# Patient Record
Sex: Male | Born: 1950 | Hispanic: No | Marital: Married | Smoking: Former smoker
Health system: Southern US, Community
[De-identification: ages and names within clinical notes are randomized; demographics above are authoritative.]

## PROBLEM LIST (undated history)

## (undated) DIAGNOSIS — G4730 Sleep apnea, unspecified: Secondary | ICD-10-CM

## (undated) DIAGNOSIS — M1990 Unspecified osteoarthritis, unspecified site: Secondary | ICD-10-CM

## (undated) DIAGNOSIS — I10 Essential (primary) hypertension: Secondary | ICD-10-CM

## (undated) DIAGNOSIS — Z974 Presence of external hearing-aid: Secondary | ICD-10-CM

## (undated) DIAGNOSIS — A6920 Lyme disease, unspecified: Secondary | ICD-10-CM

## (undated) DIAGNOSIS — J45909 Unspecified asthma, uncomplicated: Secondary | ICD-10-CM

## (undated) DIAGNOSIS — I2699 Other pulmonary embolism without acute cor pulmonale: Secondary | ICD-10-CM

## (undated) DIAGNOSIS — Z87828 Personal history of other (healed) physical injury and trauma: Secondary | ICD-10-CM

## (undated) HISTORY — PX: HX KNEE ARTHROSCOPY: SHX127

## (undated) HISTORY — DX: Essential (primary) hypertension: I10

## (undated) HISTORY — DX: Lyme disease, unspecified: A69.20

## (undated) HISTORY — DX: Sleep apnea, unspecified: G47.30

## (undated) HISTORY — PX: KNEE ARTHROSCOPY: SUR90

## (undated) HISTORY — PX: HERNIA REPAIR: SHX51

## (undated) HISTORY — PX: BACK SURGERY: SHX140

## (undated) HISTORY — DX: Other pulmonary embolism without acute cor pulmonale: I26.99

---

## 1998-03-21 LAB — HM COLONOSCOPY: HM COLON: NORMAL

## 2007-06-15 ENCOUNTER — Ambulatory Visit: Payer: Self-pay

## 2008-03-21 DIAGNOSIS — I2699 Other pulmonary embolism without acute cor pulmonale: Secondary | ICD-10-CM

## 2008-03-21 HISTORY — DX: Other pulmonary embolism without acute cor pulmonale: I26.99

## 2008-07-19 ENCOUNTER — Ambulatory Visit: Admission: RE | Admit: 2008-07-19 | Payer: Self-pay | Source: Ambulatory Visit | Admitting: Family Medicine

## 2008-12-22 ENCOUNTER — Ambulatory Visit: Admit: 2008-12-22 | Payer: Self-pay | Source: Ambulatory Visit

## 2008-12-26 ENCOUNTER — Ambulatory Visit: Admission: RE | Admit: 2008-12-26 | Payer: Self-pay | Source: Ambulatory Visit

## 2009-07-13 ENCOUNTER — Ambulatory Visit: Admission: RE | Admit: 2009-07-13 | Payer: Self-pay | Source: Ambulatory Visit

## 2012-08-29 ENCOUNTER — Other Ambulatory Visit (HOSPITAL_BASED_OUTPATIENT_CLINIC_OR_DEPARTMENT_OTHER): Payer: Self-pay | Admitting: Rheumatology

## 2012-09-11 ENCOUNTER — Ambulatory Visit (HOSPITAL_BASED_OUTPATIENT_CLINIC_OR_DEPARTMENT_OTHER)
Admission: RE | Admit: 2012-09-11 | Discharge: 2012-09-11 | Disposition: A | Discharge status: Home / Self Care | Source: Ambulatory Visit | Attending: Rheumatology | Admitting: Rheumatology

## 2012-09-11 DIAGNOSIS — M25569 Pain in unspecified knee: Secondary | ICD-10-CM | POA: Insufficient documentation

## 2012-09-11 DIAGNOSIS — M25469 Effusion, unspecified knee: Secondary | ICD-10-CM | POA: Insufficient documentation

## 2012-10-02 ENCOUNTER — Encounter (INDEPENDENT_AMBULATORY_CARE_PROVIDER_SITE_OTHER): Payer: Self-pay | Admitting: Emergency Medical Services

## 2012-10-02 ENCOUNTER — Ambulatory Visit (INDEPENDENT_AMBULATORY_CARE_PROVIDER_SITE_OTHER): Admitting: Emergency Medical Services

## 2012-10-02 VITALS — BP 134/70 | HR 71 | Temp 97.4°F | Resp 16 | Ht 70.0 in | Wt 199.0 lb

## 2012-10-02 MED ORDER — CEPHALEXIN 500 MG CAPSULE
500.00 mg | ORAL_CAPSULE | Freq: Two times a day (BID) | ORAL | Status: AC
Start: 2012-10-02 — End: 2012-10-12

## 2012-10-02 NOTE — Patient Instructions (Signed)
[facility]  [address]  [place], [place] [address]  Phone: [phone]  Fax: [phone]               Open Daily 12:00pm - 8:00pm ~ Closed Thanksgiving and Christmas Day     Attending Caregiver: Threasa Alpha, MD      Today's orders:   Orders Placed This Encounter   . cephALEXin (KEFLEX) 500 mg Oral Capsule        Prescription(s) E-Rx to:  [id number] - [place], [place] - [address]    ________________________________________________________________________  Short Term Disability and Family Medical Leave Act  [facility] does NOT provide assistance with any disability applications.  If you feel your medical condition requires you to be on disability, you will need to follow up with  Your primary care physician or a specialist.  We apologize for any inconvenience.    For Medication Prescribed by [facility]:  As an Urgent Care facility, our clinic does NOT offer prescription refills over the telephone.    If you need more of the medication one of our medical providers prescribed, you will  Either need to be re-evaluated by [place] or see your primary care physician.    ________________________________________________________________________      It is very important that we have a phone number that is the single best way to contact you in the event that we become aware of important clinical information or concerns after your discharge.  If the phone number you provided at registration is NOT this number you should inform staff and registration prior to leaving.       Your treatment and evaluation today was focused on identifying and treating potentially emergent conditions based on your presenting signs, symptoms, and history.  The resulting initial clinical impression and treatment plan is not intended to be definitive or a substitute for a full physical examination and evaluation by your primary care provider.  If your symptoms persist, worsen, or you develop any new or concerning symptoms, you need to be evaluated.      If you received x-rays during your visit, be aware that the final and formal interpretation of those films by a radiologist may occur after your discharge.  If there is a significant discrepancy identified after your discharge, we will contact you at the telephone number provided at registration.      If you received a pelvic exam, you may have cultures pending for sexually transmitted diseases.  Positive cultures are reported to the [facility] Department of Health as required by state law.  You should be contacted if you cultures are positive.  We will not contact you if they are negative.  You did NOT receive a PAP smear (the screening test for cervical).  This specific test for women is best performed by your gynecologist or primary care provider when indicated.      If you are over 57 year old, we cannot discuss your personal health information with a parent, spouse, family member, or anyone else without your express consent.  This does not include those who have legitimate access to your records and information to assist in your care under the provisions of HIPAA [facility] Portability and Accountability Act) law, or those to whom you have previously given express written consent to do so, such a legal guardian or Power of [place].       You may have received medication that may cause you  to feel drowsy and/or light headed for several hours.  You may even experience some amnesia of your stay.  You should avoid operating a motor vehicle or performing any activity requiring complete alertness or coordination until you feel fully awake (approximately 24-48 hours).  Avoid alcoholic beverages.  You may also have a dry mouth for several hours.  This is a normal side effect and will disappear as the effects of the medication wear off.      Instructions discussed with patient upon discharge by clinical staff with all questions answered.  Please call [facility] [phone]) if any further questions.  Go immediately to the emergency department if any concern or worsening symptoms.      Threasa Alpha, MD 10/02/2012, 1:33 PM          Spider Bite  Most spider bites do not cause serious problems.  HOME CARE   Do not scratch the bite.   Keep the bite clean and dry. Wash the bite with soap and water as told by your doctor.   Put ice on the bite.   Put ice in a plastic bag.   Place a towel between your skin and the bag.   Leave the ice on for 20 minutes. Do this 4 times a day for the first 2 to 3 days or as told by your doctor.   Raise (elevate) the bite above your heart.   Only take medicine as told by your doctor.   If you are given medicines (antibiotics), take them as told. Finish them even if you start to feel better.  You may need a tetanus shot if:   You cannot remember when you had your last tetanus shot.   You have never had a tetanus shot.   The bite broke your skin.  If you need a tetanus shot and you choose not to have one, you may get tetanus. Sickness from tetanus can be serious.  GET HELP RIGHT AWAY IF:   Your bite turns purple.   Your bite gets more puffy (swollen), painful, or red.   You are short of breath or have chest pain.   You have muscle cramps or painful muscle spasms.   You have belly (abdominal) pain.    You feel sick to your stomach (nauseous) or throw up (vomit).   You feel very tired or sleepy.   Your bite is not better after 3 days of treatment.  MAKE SURE YOU:   Understand these instructions.   Will watch your condition.   Will get help right away if you are not doing well or get worse.  Document Released: 04/09/2010 Document Revised: 05/30/2011 Document Reviewed: 10/06/2010  [facility] Patient Information 2014 [place], [place].  Clostridium Difficile Infection  Clostridium difficile (C. diff) is a germ found in the intestines. C. diff infection can occur after taking antibiotic medicines. C. diff infection can cause watery poop (diarrhea) or severe disease.  HOME CARE    Drink enough fluids to keep your pee (urine) clear or pale yellow. Avoid milk, caffeine, and alcohol.   Ask your doctor how to replace body fluid losses (rehydrate).   Eat small meals more often rather than large meals.   Take your medicine (antibiotics) as told. Finish it even if you start to feel better.   Do not  use medicines to slow the watery poop.   Wash your hands well after using the bathroom and before preparing food.   Make sure people who live with  you wash their hands often.  GET HELP RIGHT AWAY IF:    The watery poop does not stop, or it comes back after you finish your medicine.   You feel very dry or thirsty (dehydrated).   You have a fever.   You have more belly (abdominal) pain or tenderness.   There is blood in your poop (stool), or your poop is black and tar-like.   You cannot eat food or drink liquids without throwing up (vomiting).  MAKE SURE YOU:    Understand these instructions.   Will watch your condition.   Will get help right away if you are not doing well or get worse.  Document Released: 01/02/2009 Document Revised: 05/30/2011 Document Reviewed: 08/13/2010  [facility] Patient Information 2014 [place], [place].

## 2012-10-02 NOTE — Progress Notes (Signed)
 BP 134/70  Pulse 71  Temp(Src) 36.3 C (97.4 F) (Oral)  Resp 16  Ht 1.778 m (5' 10)  Wt 90.266 kg (199 lb)  BMI 28.55 kg/m2  Catalina Rigg, RTR 10/02/2012, 1:00 PM

## 2012-10-02 NOTE — Progress Notes (Signed)
 Subjective:               62 year old male presents to urgent care with an spider bite to his left calf that occurred approximately one week ago. He now has some associated redness, which has spread outward from the bite mark. He denies any associated fevers. The patient denies any recent known tick exposure. He does have chronic Lyme disease for which he takes methotrexate. The patient has also been bitten by a brown recluse spider in the past. He denies any shortness of breath, wheezes, nausea, vomiting, or diarrhea. He has tried topical Neosporin with little relief of symptoms. He denies any eye or ear pain, burning urination, or blood in his stool.     Current Outpatient Prescriptions   Medication Sig   .     . cetirizine (ZYRTEC) 10 mg Oral Tablet Take 10 mg by mouth Once a day   . hydrochlorothiazide (HYDRODIURIL) 25 mg Oral Tablet Take 25 mg by mouth Once a day   . Methotrexate (TREXALL) 10 mg Oral Tablet Take by mouth       Family History   Problem Relation Age of Onset   . Hypertension Mother    . Diabetes Mother        Active Ambulatory Problems     Diagnosis Date Noted   . No Active Ambulatory Problems     Resolved Ambulatory Problems     Diagnosis Date Noted   . No Resolved Ambulatory Problems     Past Medical History   Diagnosis Date   . Lyme disease    . HTN (hypertension)        Allergies   Allergen Reactions   . Amoxicillin        Objective:  Vitals Signs are stable   Filed Vitals:    10/02/12 1256   BP: 134/70   Pulse: 71   Temp: 36.3 C (97.4 F)   TempSrc: Oral   Resp: 16   Height: 1.778 m (5' 10)   Weight: 90.266 kg (199 lb)     Gen: no acute distress, non toxic looking  HEENT:  NCAT, PERRL, TMs wnl  Heart:  Regular without murmur  Lungs:  Clear without wheeze  Abd:  Soft, nt/nd, positive bowel sounds present  Extremities: No left calf edema, negative Homan's sign,  Skin:  Left medial calf erythematous macular rash, approximately 5 cm in diameter.      Labs:  None.      Assessment:              ICD-9-CM   1. Spider bite 989.5         Plan:        Orders Placed This Encounter   . cephALEXin  (KEFLEX ) 500 mg Oral Capsule              Physician advised patient to take full course of medicine, watch for diarrhea (C. Difficile discussed with patient) and signs of allergic reaction, ie, swelling, rash, difficulty breathing or swallowing.  Physician recommended ingestion of yogurt or probiotics while taking antibiotics. Instructional handouts provided. Plan was discussed and patient/parent/guardian verbalized understanding.  If symptoms are not improving the patient should return to the Urgent Care for further evaluation. If symptoms are worsening or emergent present to Emergency Department for higher level of evaluation and care. Patient voiced understanding and agreeable. All questions answered. Patient appreciative.       Alvina HILARIO Bathe, MD

## 2012-10-03 ENCOUNTER — Encounter (INDEPENDENT_AMBULATORY_CARE_PROVIDER_SITE_OTHER): Payer: Self-pay | Admitting: Emergency Medical Services

## 2012-10-03 NOTE — Progress Notes (Signed)
Patient seen at urgent care yesterday. Called to follow up and feeling about the same. Advised patient to return to the clinic if symptoms have not improved over the next several days. Advised to call if further questions or concerns.

## 2014-02-26 ENCOUNTER — Ambulatory Visit (INDEPENDENT_AMBULATORY_CARE_PROVIDER_SITE_OTHER): Admitting: Family Medicine

## 2014-02-26 ENCOUNTER — Encounter (INDEPENDENT_AMBULATORY_CARE_PROVIDER_SITE_OTHER): Payer: Self-pay | Admitting: Family Medicine

## 2014-02-26 VITALS — BP 136/70 | HR 62 | Temp 98.3°F | Resp 16 | Wt 215.6 lb

## 2014-02-26 DIAGNOSIS — A6923 Arthritis due to Lyme disease: Secondary | ICD-10-CM

## 2014-02-26 DIAGNOSIS — J209 Acute bronchitis, unspecified: Secondary | ICD-10-CM

## 2014-02-26 MED ORDER — AZITHROMYCIN 250 MG TABLET
ORAL_TABLET | ORAL | Status: DC
Start: 2014-02-26 — End: 2014-04-08

## 2014-02-26 MED ORDER — ALBUTEROL SULFATE 2.5 MG/3 ML (0.083 %) SOLUTION FOR NEBULIZATION
2.50 mg | INHALATION_SOLUTION | RESPIRATORY_TRACT | Status: DC | PRN
Start: 2014-02-26 — End: 2014-04-08

## 2014-02-26 MED ORDER — BENZONATATE 200 MG CAPSULE
200.00 mg | ORAL_CAPSULE | Freq: Three times a day (TID) | ORAL | Status: DC | PRN
Start: 2014-02-26 — End: 2014-04-08

## 2014-02-26 NOTE — Progress Notes (Signed)
Isaiah DuelErnest Patrick Dorn  Date of Service: 02/26/2014    Chief complaint:   Chief Complaint   Patient presents with    Cough    Chest Congestion     Subjective  63 y.o. male comes to Urgent Care clinic today for 6 days of cough and low grade temp.  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  Formerl;y worked in [employer] - lots of foreign travel but is now retired..     ROS: denies headaches, dizziness, chest pain or shortness of breath, denies black or bloody stools, abdominal pain, weakness or difficulty with ambulation, denies skin rash or lesion    History   Substance Use Topics    Smoking status: Former Smoker    Smokeless tobacco: Not on file    Alcohol Use: No     There is no problem list on file for this patient.    Family History   Problem Relation Age of Onset    Hypertension Mother     Diabetes Mother            Outpatient Prescriptions Prior to Visit:  cetirizine (ZYRTEC) 10 mg Oral Tablet Take 10 mg by mouth Once a day   hydrochlorothiazide (HYDRODIURIL) 25 mg Oral Tablet Take 25 mg by mouth Once a day   Methotrexate (TREXALL) 10 mg Oral Tablet Take by mouth     No facility-administered medications prior to visit.    Objective  Vitals: BP 136/70 mmHg   Pulse 62   Temp(Src) 36.8 C (98.3 F) (Oral)   Resp 16   Wt 97.796 kg (215 lb 9.6 oz)  General: no distress  Eyes: Conjunctiva clear., Pupils equal and round. , Sclera non-icteric.   HENT:TM's Clear. , Ear canals normal. , Nose without erythema. , Mouth mucous membranes moist. , Pharynx without injection or exudate. , has hearing aids in place  Neck: supple, symmetrical, trachea midline and mild anterior cervical adenopathy  Lungs: clear to auscultation bilaterally.   Cardiovascular:    Heart regular rate and rhythm without murmer  Abdomen: soft, non-tender, bowel sounds normal and no hepatosplenomegaly  Extremities: his right index finger  is still a little puffy  Skin: Skin warm and dry and No rashes    Assessment/Plan  1. 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         Pt verbalized understanding and agrees with the plan of care. Call or return to clinic prn if these symptoms worsen or fail to improve as anticipated.    Concha SeGail Kristin Akeisha Lagerquist, MD

## 2014-02-26 NOTE — Progress Notes (Signed)
BP 136/70 mmHg   Pulse 62   Temp(Src) 36.8 C (98.3 F) (Oral)   Resp 16   Wt 97.796 kg (215 lb 9.6 oz)  Isaiah Bryant Yetta BarreN Shatima Zalar, RN  02/26/2014, 10:28

## 2014-02-27 ENCOUNTER — Encounter (INDEPENDENT_AMBULATORY_CARE_PROVIDER_SITE_OTHER): Payer: Self-pay | Admitting: Family Medicine

## 2014-02-27 NOTE — Progress Notes (Signed)
Patient seen at urgent care yesterday. Called to follow up and feeling better. Advised to call if further questions or concerns.

## 2014-04-08 ENCOUNTER — Ambulatory Visit (INDEPENDENT_AMBULATORY_CARE_PROVIDER_SITE_OTHER): Admitting: Family Medicine

## 2014-04-08 ENCOUNTER — Encounter (INDEPENDENT_AMBULATORY_CARE_PROVIDER_SITE_OTHER): Payer: Self-pay | Admitting: Family Medicine

## 2014-04-08 VITALS — BP 123/70 | HR 57 | Temp 98.5°F | Ht 70.0 in | Wt 210.4 lb

## 2014-04-08 DIAGNOSIS — B370 Candidal stomatitis: Secondary | ICD-10-CM

## 2014-04-08 MED ORDER — NYSTATIN 100,000 UNIT/ML ORAL SUSPENSION
ORAL | Status: AC
Start: 2014-04-08 — End: ?

## 2014-04-08 NOTE — Progress Notes (Signed)
BP 123/70 mmHg   Pulse 57   Temp(Src) 36.9 C (98.5 F) (Oral)   Ht 1.778 m (5\' 10" )   Wt 95.437 kg (210 lb 6.4 oz)   BMI 30.19 kg/m2  Maximiano CossJacqueline Milton Streicher, RTR  04/08/2014, 10:16

## 2014-04-08 NOTE — Progress Notes (Signed)
Isaiah Bryant  Date of Service: 04/08/2014    Chief complaint:   Chief Complaint   Patient presents with    Thrush     Subjective  64 y.o. male comes to Urgent Care clinic today for thrush - he has had it before and recognizes it.  He attributes it to the recent increase in MTX to 20 mg.  Roof of mouth and posterior pharynx, not in esophagus, no pain with swallowing     ROS:  denies headaches, dizziness, chest pain or shortness of breath, denies black or bloody stools, abdominal pain, weakness or difficulty with ambulation, denies skin rash or lesion    History   Substance Use Topics    Smoking status: Former Smoker    Smokeless tobacco: Not on file    Alcohol Use: No     There is no problem list on file for this patient.    Family History   Problem Relation Age of Onset    Hypertension Mother     Diabetes Mother            Outpatient Prescriptions Prior to Visit:  cetirizine (ZYRTEC) 10 mg Oral Tablet Take 10 mg by mouth Once a day   hydrochlorothiazide (HYDRODIURIL) 25 mg Oral Tablet Take 25 mg by mouth Once a day   Methotrexate (TREXALL) 10 mg Oral Tablet Take 20 mg by mouth    albuterol sulfate (PROVENTIL) 2.5 mg /3 mL (0.083 %) Inhalation Solution for Nebulization 3 mL (2.5 mg total) by Nebulization route Every 4 hours as needed for Wheezing   azithromycin (ZITHROMAX) 250 mg Oral Tablet 2 tablets day 1, then 1 tablet daily   Benzonatate (TESSALON) 200 mg Oral Capsule Take 1 Cap (200 mg total) by mouth Three times a day as needed for Cough     No facility-administered medications prior to visit.    Objective  Vitals: BP 123/70 mmHg   Pulse 57   Temp(Src) 36.9 C (98.5 F) (Oral)   Ht 1.778 m (5\' 10" )   Wt 95.437 kg (210 lb 6.4 oz)   BMI 30.19 kg/m2  General: appears in good health  HENT:ENT without erythema or injection, mucous membranes moist; he does have thrush lesions on palate and posterior pharynx  Lungs: clear to auscultation bilaterally.   Cardiovascular:    Heart regular rate and rhythm  without murmer    Assessment/Plan  1. Thrush        Orders Placed This Encounter    nystatin (MYCOSTATIN) 100,000 unit/mL Oral Suspension     Swish and spit as he is fairly sure it is only in mouth, as it has been caught early    Pt verbalized understanding and agrees with the plan of care. Call or return to clinic prn if these symptoms worsen or fail to improve as anticipated.    Concha SeGail Kristin Denisa Enterline, MD

## 2014-04-08 NOTE — Patient Instructions (Signed)
[facility]  [address]  [place], [place] [id number]  Phone: [phone]  Fax: [phone]               Open Mon - Sat 8:00am - 8:00pm [place] CrockerSun Noon- 8:00pm                                                              ~ Closed Thanksgiving and Christmas Day     Attending Caregiver: Concha SeGail Kristin Emalene Welte, MD      Today's orders:   Orders Placed This Encounter    nystatin (MYCOSTATIN) 100,000 unit/mL Oral Suspension        Prescription(s) E-Rx to:  [facility] - [place], [place] - [address]    ________________________________________________________________________  Short Term Disability and Family Medical Leave Act  [facility] does NOT provide assistance with any disability applications.  If you feel your medical condition requires you to be on disability, you will need to follow up with  Your primary care physician or a specialist.  We apologize for any inconvenience.    For Medication Prescribed by [facility]:  As an Urgent Care facility, our clinic does NOT offer prescription refills over the telephone.    If you need more of the medication one of our medical providers prescribed, you will  Either need to be re-evaluated by us or see your primary care physician.    ________________________________________________________________________      It is very important that we have a phone number that is the single best way to contact you in the event that we become aware of important clinical information or concerns after your discharge.  If the phone number you provided at registration is NOT this number you should inform staff and registration prior to leaving.      Your treatment and evaluation today was focused on identifying and treating potentially emergent conditions based on your presenting signs, symptoms, and history.  The resulting initial clinical impression and treatment plan is not intended to be definitive or a substitute for a full physical examination  and evaluation by your primary care provider.  If your symptoms persist, worsen, or you develop any new or concerning symptoms, you need to be evaluated.      If you received x-rays during your visit, be aware that the final and formal interpretation of those films by a radiologist may occur after your discharge.  If there is a significant discrepancy identified after your discharge, we will contact you at the telephone number provided at registration.      If you received a pelvic exam, you may have cultures pending for sexually transmitted diseases.  Positive cultures are reported to the [facility] Department of Health as required by state law.  You should be contacted if you cultures are positive.  We will not contact you if they are negative.  You did NOT receive a PAP smear (the screening test for cervical).  This specific test for women is best performed by your gynecologist or primary care provider when indicated.      If you are over 521 year old, we cannot discuss your personal health information with a parent, spouse, family member, or anyone else without your express consent.  This  does not include those who have legitimate access to your records and information to assist in your care under the provisions of HIPAA [facility] Portability and Accountability Act) law, or those to whom you have previously given express written consent to do so, such a legal guardian or Power of [place].      You may have received medication that may cause you to feel drowsy and/or light headed for several hours.  You may even experience some amnesia of your stay.  You should avoid operating a motor vehicle or performing any activity requiring complete alertness or coordination until you feel fully awake (approximately 24-48 hours).  Avoid alcoholic beverages.  You may also have a dry mouth for several hours.  This is a normal side effect and will disappear as the effects of the medication wear off.      Instructions discussed  with patient upon discharge by clinical staff with all questions answered.  Please call  Urgent Care [phone]) if any further questions.  Go immediately to the emergency department if any concern or worsening symptoms.      Concha Se, MD 04/08/2014, 10:43

## 2014-04-09 ENCOUNTER — Encounter (INDEPENDENT_AMBULATORY_CARE_PROVIDER_SITE_OTHER): Payer: Self-pay

## 2014-04-09 NOTE — Progress Notes (Signed)
Pt seen in urgent care yesterday. Called today to follow up, pt states he is not feeling better. Patient states that he was able to pick up his prescribed medication. No questions or concerns at this time. Informed pt to call back if there are any questions or concerns.   Marcy Sirenharles Xela Oregel, RTR  04/09/2014, 08:45

## 2014-05-07 LAB — BASIC METABOLIC PANEL
BUN: 18 mg/dL (ref 4–21)
CREATININE: 1 mg/dL (ref 0.6–1.3)
Glucose: 98 mg/dL
Potassium: 3.8 mmol/L (ref 3.4–5.3)
Sodium: 137 mmol/L (ref 137–147)

## 2014-05-07 LAB — HEPATIC FUNCTION PANEL
ALT: 22 U/L (ref 10–40)
AST: 23 U/L (ref 14–40)
Alkaline Phosphatase: 45 U/L (ref 25–125)
BILIRUBIN, TOTAL: 0.7 mg/dL

## 2014-05-07 LAB — CBC AND DIFFERENTIAL
HCT: 45 % (ref 41–53)
HEMOGLOBIN: 15.8 g/dL (ref 13.5–17.5)
Platelets: 249 10*3/uL (ref 150–399)
WBC: 7.2 10^3/mL

## 2014-05-26 ENCOUNTER — Ambulatory Visit: Payer: Self-pay | Admitting: Nurse Practitioner

## 2014-06-12 ENCOUNTER — Ambulatory Visit (INDEPENDENT_AMBULATORY_CARE_PROVIDER_SITE_OTHER): Admitting: Nurse Practitioner

## 2014-06-12 ENCOUNTER — Encounter: Payer: Self-pay | Admitting: Nurse Practitioner

## 2014-06-12 ENCOUNTER — Encounter (INDEPENDENT_AMBULATORY_CARE_PROVIDER_SITE_OTHER): Payer: Self-pay

## 2014-06-12 VITALS — BP 130/68 | HR 59 | Temp 98.2°F | Resp 12 | Ht 68.5 in | Wt 214.8 lb

## 2014-06-12 DIAGNOSIS — Z8619 Personal history of other infectious and parasitic diseases: Secondary | ICD-10-CM | POA: Diagnosis not present

## 2014-06-12 DIAGNOSIS — I158 Other secondary hypertension: Secondary | ICD-10-CM

## 2014-06-12 DIAGNOSIS — G4733 Obstructive sleep apnea (adult) (pediatric): Secondary | ICD-10-CM

## 2014-06-12 DIAGNOSIS — Z9989 Dependence on other enabling machines and devices: Secondary | ICD-10-CM

## 2014-06-12 DIAGNOSIS — I10 Essential (primary) hypertension: Secondary | ICD-10-CM | POA: Insufficient documentation

## 2014-06-12 DIAGNOSIS — Z7189 Other specified counseling: Secondary | ICD-10-CM

## 2014-06-12 DIAGNOSIS — Z7689 Persons encountering health services in other specified circumstances: Secondary | ICD-10-CM

## 2014-06-12 DIAGNOSIS — Z86711 Personal history of pulmonary embolism: Secondary | ICD-10-CM

## 2014-06-12 NOTE — Assessment & Plan Note (Signed)
Many rounds of doxycyline and now weaning off of methotrexate per rheumatology in [place]. Will refer to rheumatology here.

## 2014-06-12 NOTE — Progress Notes (Signed)
Pre visit review using our clinic review tool, if applicable. No additional management support is needed unless otherwise documented below in the visit note. 

## 2014-06-12 NOTE — Assessment & Plan Note (Signed)
Pt on CPAP and controlled

## 2014-06-12 NOTE — Progress Notes (Signed)
Subjective:    Patient ID: Samuel Crawford, male    DOB: 08/08/1950, 64 y.o.   MRN: [id number]  HPI  Samuel Crawford is a 64 yo male establishing care today.   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   PSA- No prostate issues  Eye Exam- UTD  Dental Exam- No dental insurance   2) Chronic Problems-  PE from the treatments for lyme disease (PICC line).   HTN- Usually can stop the HCTZ once off of Methotrexate.    3) Acute Problems-  Lyme- withdrawing currently from Methotrexate    Rheumatology referral   HTN- controlled   OSA- CPAP- 8   Hepatits B- 1st round in 1995 and no second   Review of Systems  Constitutional: Negative for fever, chills, diaphoresis and fatigue.  Eyes: Negative for visual disturbance.  Respiratory: Negative for chest tightness, shortness of breath and wheezing.   Cardiovascular: Negative for chest pain, palpitations and leg swelling.  Gastrointestinal: Negative for nausea, vomiting, diarrhea and rectal pain.  Skin: Negative for rash.  Neurological: Negative for dizziness, weakness and numbness.  Psychiatric/Behavioral: The patient is not nervous/anxious.    Past Medical History  Diagnosis Date  . Lyme disease   . Pulmonary embolism     as a result of a PICC line x 2, 1 year apart  . Hypertension   . Sleep apnea     CPAP 8     History   Social History  . Marital Status: Married    Spouse Name: N/A  . Number of Children: N/A  . Years of Education: N/A   Occupational History  . Not on file.   Social History Main Topics  . Smoking status: Former Smoker    Quit date: 03/21/1986  . Smokeless tobacco: Never Used  . Alcohol Use: 0.0 oz/week    0 Standard drinks or equivalent per week     Comment: 1 beer a month  .  Drug Use: No  . Sexual Activity: Not Currently   Other Topics Concern  . Not on file   Social History Narrative   Left handed    Graduate school    Retired from [employer]    Enjoys video games in free time    Lives at [facility]    Past Surgical History  Procedure Laterality Date  . Knee arthroscopy Bilateral     right knee 1992, left 2010  . Hernia repair Right inguinal    1988    Family History  Problem Relation Age of Onset  . Diabetes Mother   . Diabetes Maternal Grandmother     Allergies  Allergen Reactions  . Amoxicillin Rash    No current outpatient prescriptions on file prior to visit.   No current facility-administered medications on file prior to visit.      Objective:   Physical Exam  Constitutional: He is oriented to person, place, and time. He appears well-developed and well-nourished. No distress.  BP 130/68 mmHg  Pulse 59  Temp(Src) 98.2 F (36.8 C) (Oral)  Resp 12  Ht 5' 8.5" (1.74 m)  Wt 214 lb 12 oz (97.41 kg)  BMI 32.17 kg/m2  SpO2 95%   HENT:  Head: Normocephalic  and atraumatic.  Right Ear: External ear normal.  Left Ear: External ear normal.  Eyes: Right eye exhibits no discharge. Left eye exhibits no discharge. No scleral icterus.  Glasses  Neck: Normal range of motion. Neck supple. No thyromegaly present.  Cardiovascular: Normal rate, regular rhythm, normal heart sounds and intact distal pulses.  Exam reveals no gallop and no friction rub.   No murmur heard. Pulmonary/Chest: Effort normal and breath sounds normal. No respiratory distress. He has no wheezes. He has no rales. He exhibits no tenderness.  Abdominal: Soft. Bowel sounds are normal. He exhibits no distension and no mass. There is no tenderness. There is no rebound and no guarding.  Obese  Musculoskeletal: Normal range of motion. He exhibits no edema or tenderness.  Lymphadenopathy:    He has no cervical adenopathy.  Neurological: He is alert and oriented  to person, place, and time.  Skin: Skin is warm and dry. No rash noted. He is not diaphoretic.  Psychiatric: He has a normal mood and affect. His behavior is normal. Judgment and thought content normal.      Assessment & Plan:

## 2014-06-12 NOTE — Patient Instructions (Signed)
Our referral coordinator will be in touch with you in the next few weeks.   Welcome to [facility]!

## 2014-06-12 NOTE — Assessment & Plan Note (Signed)
No need for anticoagulant therapy. Happened from PICC lines.

## 2014-06-12 NOTE — Assessment & Plan Note (Signed)
Discussed acute and chronic issues. Reviewed health maintenance measures, PFSHx, and immunizations.   

## 2014-06-12 NOTE — Assessment & Plan Note (Signed)
HTN secondary to Methotrexate. Will wean off of this and then discuss stopping HCTZ.

## 2014-07-09 ENCOUNTER — Encounter: Payer: Self-pay | Admitting: Nurse Practitioner

## 2014-07-09 ENCOUNTER — Ambulatory Visit (INDEPENDENT_AMBULATORY_CARE_PROVIDER_SITE_OTHER): Admitting: Nurse Practitioner

## 2014-07-09 VITALS — BP 116/62 | HR 62 | Temp 97.8°F | Resp 12 | Ht 68.5 in | Wt 213.8 lb

## 2014-07-09 DIAGNOSIS — I158 Other secondary hypertension: Secondary | ICD-10-CM

## 2014-07-09 DIAGNOSIS — Z79899 Other long term (current) drug therapy: Secondary | ICD-10-CM

## 2014-07-09 DIAGNOSIS — Z23 Encounter for immunization: Secondary | ICD-10-CM

## 2014-07-09 DIAGNOSIS — Z79631 Long term (current) use of antimetabolite agent: Secondary | ICD-10-CM

## 2014-07-09 LAB — CBC WITH DIFFERENTIAL/PLATELET
Basophils Absolute: 0 10*3/uL (ref 0.0–0.1)
Basophils Relative: 0.4 % (ref 0.0–3.0)
EOS ABS: 0.1 10*3/uL (ref 0.0–0.7)
EOS PCT: 1.2 % (ref 0.0–5.0)
HEMATOCRIT: 45.8 % (ref 39.0–52.0)
Hemoglobin: 15.9 g/dL (ref 13.0–17.0)
LYMPHS ABS: 1.4 10*3/uL (ref 0.7–4.0)
Lymphocytes Relative: 20.6 % (ref 12.0–46.0)
MCHC: 34.8 g/dL (ref 30.0–36.0)
MCV: 94.7 fl (ref 78.0–100.0)
MONO ABS: 0.5 10*3/uL (ref 0.1–1.0)
Monocytes Relative: 7.8 % (ref 3.0–12.0)
Neutro Abs: 4.7 10*3/uL (ref 1.4–7.7)
Neutrophils Relative %: 70 % (ref 43.0–77.0)
PLATELETS: 219 10*3/uL (ref 150.0–400.0)
RBC: 4.84 Mil/uL (ref 4.22–5.81)
RDW: 13.8 % (ref 11.5–15.5)
WBC: 6.7 10*3/uL (ref 4.0–10.5)

## 2014-07-09 LAB — HEPATIC FUNCTION PANEL
ALK PHOS: 55 U/L (ref 39–117)
ALT: 20 U/L (ref 0–53)
AST: 26 U/L (ref 0–37)
Albumin: 4.2 g/dL (ref 3.5–5.2)
BILIRUBIN DIRECT: 0.1 mg/dL (ref 0.0–0.3)
TOTAL PROTEIN: 6.5 g/dL (ref 6.0–8.3)
Total Bilirubin: 0.5 mg/dL (ref 0.2–1.2)

## 2014-07-09 MED ORDER — HYDROCORTISONE VALERATE 0.2 % EX OINT: 1.0000 "application " | TOPICAL_OINTMENT | Freq: Two times a day (BID) | CUTANEOUS | Status: DC

## 2014-07-09 MED ORDER — HYDROCHLOROTHIAZIDE 25 MG PO TABS: 25.0000 mg | ORAL_TABLET | Freq: Every day | ORAL | Status: DC

## 2014-07-09 NOTE — Progress Notes (Signed)
Pre visit review using our clinic review tool, if applicable. No additional management support is needed unless otherwise documented below in the visit note. 

## 2014-07-09 NOTE — Progress Notes (Signed)
   Subjective:    Patient ID: Samuel Crawford, male    DOB: 1950-10-25, 64 y.o.   MRN: [id number]  HPI  Samuel Crawford is a 64 yo male with a CC of wondering about Hepatitis B series and right ear check for skin cancer.   1) Right ear-   Has had pre-melanoma places removed from face in past.   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 On Methotrexate, reports he needs to wean off and wait 3 months before vaccinations.   3) HCTZ refill- Doing well, no concerns with blood pressure   Review of Systems  Constitutional: Negative for fever, chills, diaphoresis and fatigue.  Eyes: Negative for visual disturbance.  Cardiovascular: Negative for chest pain, palpitations and leg swelling.  Gastrointestinal: Negative for nausea, vomiting and diarrhea.  Endocrine: Negative for polyuria.  Skin: Negative for rash.       Texture change to right ear cartilage  Neurological: Negative for dizziness, weakness and numbness.      Objective:   Physical Exam  Constitutional: He is oriented to person, place, and time. He appears well-developed and well-nourished. No distress.  BP 116/62 mmHg  Pulse 62  Temp(Src) 97.8 F (36.6 C) (Oral)  Resp 12  Ht 5' 8.5" (1.74 m)  Wt 213 lb 12.8 oz (96.979 kg)  BMI 32.03 kg/m2  SpO2 95%   HENT:  Head: Normocephalic and atraumatic.  Right Ear: External ear normal.  Left Ear: External ear normal.  Cardiovascular: Normal rate, regular rhythm, normal heart sounds and intact distal pulses.  Exam reveals no gallop and no friction rub.   No murmur heard. Pulmonary/Chest: Effort normal and breath sounds normal. No respiratory distress. He has no wheezes. He has no rales. He exhibits no tenderness.  Neurological: He is alert and oriented to person, place, and time.  Skin: Skin is warm and dry. No rash noted. He is not diaphoretic.  Right ear top of helix is scaly and erythematous  Psychiatric: He  has a normal mood and affect. His behavior is normal. Judgment and thought content normal.      Assessment & Plan:

## 2014-07-09 NOTE — Patient Instructions (Signed)
Please visit the lab today.   HCTZ sent to pharmacy

## 2014-07-10 LAB — HEPATITIS B SURFACE ANTIBODY, QUANTITATIVE: Hepatitis B-Post: 1 m[IU]/mL

## 2014-07-19 DIAGNOSIS — Z23 Encounter for immunization: Secondary | ICD-10-CM | POA: Insufficient documentation

## 2014-07-19 DIAGNOSIS — Z79899 Other long term (current) drug therapy: Secondary | ICD-10-CM | POA: Insufficient documentation

## 2014-07-19 DIAGNOSIS — Z79631 Long term (current) use of antimetabolite agent: Secondary | ICD-10-CM | POA: Insufficient documentation

## 2014-07-19 NOTE — Assessment & Plan Note (Signed)
Pt missed doses due to early deployment in the [employer] and was never caught up. Will obtain titer to see if candidate for series.

## 2014-07-19 NOTE — Assessment & Plan Note (Signed)
Stable.   BP Readings from Last 3 Encounters:  07/09/14 116/62  06/12/14 130/68

## 2014-07-19 NOTE — Assessment & Plan Note (Signed)
Check HFP and cbc w/ diff due to long term use of methotrexate. Pt is weaning off with help from rheumatology and plans to start Hep. B vaccinations 3 months after stopping methotrexate.

## 2014-07-24 ENCOUNTER — Telehealth: Payer: Self-pay

## 2014-07-24 NOTE — Telephone Encounter (Signed)
The pt called and stated he was having symptoms of thrush developing in his mouth.  He is hoping a nastatin rinse can be called in to treat this.  Pharmacy - [facility] in [place]  Pt callback [phone]

## 2014-07-24 NOTE — Telephone Encounter (Signed)
Patient needs to be seen for thrush. We are full today. Please sent to another site or UC. Thanks!

## 2014-07-25 ENCOUNTER — Encounter: Payer: Self-pay | Admitting: Internal Medicine

## 2014-07-25 ENCOUNTER — Ambulatory Visit (INDEPENDENT_AMBULATORY_CARE_PROVIDER_SITE_OTHER): Admitting: Internal Medicine

## 2014-07-25 VITALS — BP 106/60 | HR 62 | Temp 98.4°F | Ht 68.5 in | Wt 216.5 lb

## 2014-07-25 DIAGNOSIS — B370 Candidal stomatitis: Secondary | ICD-10-CM | POA: Diagnosis not present

## 2014-07-25 MED ORDER — NYSTATIN 100000 UNIT/ML MT SUSP: 5.0000 mL | Freq: Four times a day (QID) | OROMUCOSAL | Status: DC

## 2014-07-25 NOTE — Progress Notes (Signed)
Pre visit review using our clinic review tool, if applicable. No additional management support is needed unless otherwise documented below in the visit note. 

## 2014-07-25 NOTE — Patient Instructions (Signed)
Thrush, Adult  Thrush, also called oral candidiasis, is a fungal infection that develops in the mouth and throat and on the tongue. It causes white patches to form on the mouth and tongue. Thrush is most common in older adults, but it can occur at any age.  Many cases of thrush are mild, but this infection can also be more serious. Thrush can be a recurring problem for people who have chronic illnesses or who take medicines that limit the body's ability to fight infection. Because these people have difficulty fighting infections, the fungus that causes thrush can spread throughout the body. This can cause life-threatening blood or organ infections. CAUSES  Thrush is usually caused by a yeast called Candida albicans. This fungus is normally present in small amounts in the mouth and on other mucous membranes. It usually causes no harm. However, when conditions are present that allow the fungus to grow uncontrolled, it invades surrounding tissues and becomes an infection. Less often, other Candida species can also lead to thrush.  RISK FACTORS Thrush is more likely to develop in the following people:  People with an impaired ability to fight infection (weakened immune system).   Older adults.   People with HIV.   People with diabetes.   People with dry mouth (xerostomia).   Pregnant women.   People with poor dental care, especially those who have false teeth.   People who use antibiotic medicines.  SIGNS AND SYMPTOMS  Thrush can be a mild infection that causes no symptoms. If symptoms develop, they may include:   A burning feeling in the mouth and throat. This can occur at the start of a thrush infection.   White patches that adhere to the mouth and tongue. The tissue around the patches may be red, raw, and painful. If rubbed (during tooth brushing, for example), the patches and the tissue of the mouth may bleed easily.   A bad taste in the mouth or difficulty tasting foods.    Cottony feeling in the mouth.   Pain during eating and swallowing. DIAGNOSIS  Your health care provider can usually diagnose thrush by looking in your mouth and asking you questions about your health.  TREATMENT  Medicines that help prevent the growth of fungi (antifungals) are the standard treatment for thrush. These medicines are either applied directly to the affected area (topical) or swallowed (oral). The treatment will depend on the severity of the condition.  Mild Thrush Mild cases of thrush may clear up with the use of an antifungal mouth rinse or lozenges. Treatment usually lasts about 14 days.  Moderate to Severe Thrush  More severe thrush infections that have spread to the esophagus are treated with an oral antifungal medicine. A topical antifungal medicine may also be used.   For some severe infections, a treatment period longer than 14 days may be needed.   Oral antifungal medicines are almost never used during pregnancy because the fetus may be harmed. However, if a pregnant woman has a rare, severe thrush infection that has spread to her blood, oral antifungal medicines may be used. In this case, the risk of harm to the mother and fetus from the severe thrush infection may be greater than the risk posed by the use of antifungal medicines.  Persistent or Recurrent Thrush For cases of thrush that do not go away or keep coming back, treatment may involve the following:   Treatment may be needed twice as long as the symptoms last.   Treatment will   include both oral and topical antifungal medicines.   People with weakened immune systems can take an antifungal medicine on a continuous basis to prevent thrush infections.  It is important to treat conditions that make you more likely to get thrush, such as diabetes or HIV.  HOME CARE INSTRUCTIONS   Only take over-the-counter or prescription medicine as directed by your health care provider. Talk to your health care  provider about an over-the-counter medicine called gentian violet, which kills bacteria and fungi.   Eat plain, unflavored yogurt as directed by your health care provider. Check the label to make sure the yogurt contains live cultures. This yogurt can help healthy bacteria grow in the mouth that can stop the growth of the fungus that causes thrush.   Try these measures to help reduce the discomfort of thrush:   Drink cold liquids such as water or iced tea.   Try flavored ice treats or frozen juices.   Eat foods that are easy to swallow, such as gelatin, ice cream, or custard.   If the patches in your mouth are painful, try drinking from a straw.   Rinse your mouth several times a day with a warm saltwater rinse. You can make the saltwater mixture with 1 tsp (6 g) of salt in 8 fl oz (0.2 L) of warm water.   If you wear dentures, remove the dentures before going to bed, brush them vigorously, and soak them in a cleaning solution as directed by your health care provider.   Women who are breastfeeding should clean their nipples with an antifungal medicine as directed by their health care provider. Dry the nipples after breastfeeding. Applying lanolin-containing body lotion may help relieve nipple soreness.  SEEK MEDICAL CARE IF:  Your symptoms are getting worse or are not improving within 7 days of starting treatment.   You have symptoms of spreading infection, such as white patches on the skin outside of the mouth.   You are nursing and you have redness, burning, or pain in the nipples that is not relieved with treatment.  MAKE SURE YOU:  Understand these instructions.  Will watch your condition.  Will get help right away if you are not doing well or get worse. Document Released: 12/01/2003 Document Revised: 12/26/2012 Document Reviewed: 10/08/2012 ExitCare Patient Information 2015 [employer]. This information is not intended to replace advice given to you by your  health care provider. Make sure you discuss any questions you have with your health care provider.  

## 2014-07-25 NOTE — Progress Notes (Signed)
Subjective:    Patient ID: Samuel Crawford, male    DOB: [birth year], 64 y.o.   MRN: [id number]  HPI  Pt presents to the clinic today with c/o a thick white coating on his right cheek. He noticed this 3 days ago. He thinks it is thrush. It is painful. He is taking Methotrexate for Lyme's disease and is being followed by rheumatology. He denies fever, chills, body aches or URI symptoms. He denies difficulty swallowing. He has not tried anything OTC.  Review of Systems      Past Medical History  Diagnosis Date  . Lyme disease   . Pulmonary embolism     as a result of a PICC line x 2, 1 year apart  . Hypertension   . Sleep apnea     CPAP 8     Current Outpatient Prescriptions  Medication Sig Dispense Refill  . folic acid (FOLVITE) 1 MG tablet Take 2 mg by mouth daily.    . hydrochlorothiazide (HYDRODIURIL) 25 MG tablet Take 1 tablet (25 mg total) by mouth daily. 30 tablet 11  . hydrocortisone valerate ointment (WESTCORT) 0.2 % Apply 1 application topically 2 (two) times daily. 45 g 1  . loratadine (CLARITIN) 10 MG tablet Take 10 mg by mouth daily.    . methotrexate (RHEUMATREX) 7.5 MG tablet Take 7.5 mg by mouth once a week. Caution" Chemotherapy. Protect from light.    . Multiple Vitamin (MULTIVITAMIN) tablet Take 1 tablet by mouth daily.     No current facility-administered medications for this visit.    Allergies  Allergen Reactions  . Amoxicillin Rash    Family History  Problem Relation Age of Onset  . Diabetes Mother   . Diabetes Maternal Grandmother     History   Social History  . Marital Status: Married    Spouse Name: N/A  . Number of Children: N/A  . Years of Education: N/A   Occupational History  . Not on file.   Social History Main Topics  . Smoking status: Former Smoker    Quit date: 03/21/1986  . Smokeless tobacco: Never Used  . Alcohol Use: 0.0 oz/week    0 Standard drinks or equivalent per week     Comment: 1 beer a month  . Drug Use: No  .  Sexual Activity: Not Currently   Other Topics Concern  . Not on file   Social History Narrative   Left handed    Graduate school    Retired from [employer]    Enjoys video games in free time    Lives at [place]: Denies fever, malaise, fatigue, headache or abrupt weight changes.  HEENT: Pt reports mouth pain. Denies eye pain, eye redness, ear pain, ringing in the ears, wax buildup, runny nose, nasal congestion, bloody nose, or sore throat. Respiratory: Denies difficulty breathing, shortness of breath, cough or sputum production.   Cardiovascular: Denies chest pain, chest tightness, palpitations or swelling in the hands or feet.   No other specific complaints in a complete review of systems (except as listed in HPI above).   Objective:   Physical Exam  BP 106/60 mmHg  Pulse 62  Temp(Src) 98.4 F (36.9 C) (Oral)  Ht 5' 8.5" (1.74 m)  Wt 216 lb 8 oz (98.204 kg)  BMI 32.44 kg/m2 Wt Readings from Last 3 Encounters:  07/25/14 216 lb 8 oz (98.204 kg)  07/09/14 213 lb 12.8 oz (96.979 kg)  06/12/14 214  lb 12 oz (97.41 kg)    General: Appears his stated age, well developed, well nourished in NAD. Skin: Warm, dry and intact. No rashes, lesions or ulcerations noted. HEENT: Head: normal shape and size; EThroat/Mouth: Teeth present, mucosa pink and moist, some white coating noted on the right buccal mucosa.  Neck: No adenopathy noted.  Cardiovascular: Normal rate and rhythm. S1,S2 noted.  No murmur, rubs or gallops noted.  Pulmonary/Chest: Normal effort and positive vesicular breath sounds. No respiratory distress. No wheezes, rales or ronchi noted.     BMET    Component Value Date/Time   NA 137 05/07/2014   K 3.8 05/07/2014   BUN 18 05/07/2014   CREATININE 1.0 05/07/2014    Lipid Panel  No results found for: CHOL, TRIG, HDL, CHOLHDL, VLDL, LDLCALC  CBC    Component Value Date/Time   WBC 6.7 07/09/2014 0957   WBC 7.2 05/07/2014   RBC  4.84 07/09/2014 0957   HGB 15.9 07/09/2014 0957   HCT 45.8 07/09/2014 0957   PLT 219.0 07/09/2014 0957   MCV 94.7 07/09/2014 0957   MCHC 34.8 07/09/2014 0957   RDW 13.8 07/09/2014 0957   LYMPHSABS 1.4 07/09/2014 0957   MONOABS 0.5 07/09/2014 0957   EOSABS 0.1 07/09/2014 0957   BASOSABS 0.0 07/09/2014 0957    Hgb A1C No results found for: HGBA1C       Assessment & Plan:   Thrush:  eRx for Nystatin swish and swallow He is weaning off the MTX, advised him to continue working with his rheumatologist for this  RTC as needed or if symptoms persist or worsen

## 2014-07-28 ENCOUNTER — Encounter: Payer: Self-pay | Admitting: Nurse Practitioner

## 2014-07-30 ENCOUNTER — Other Ambulatory Visit: Payer: Self-pay | Admitting: Internal Medicine

## 2014-07-30 ENCOUNTER — Telehealth: Payer: Self-pay | Admitting: Nurse Practitioner

## 2014-07-30 NOTE — Telephone Encounter (Signed)
Please advise if okay to refill. 

## 2014-07-30 NOTE — Telephone Encounter (Signed)
Rx refill request has been forwarded to regina--please refer to Rx refill encounter

## 2014-07-30 NOTE — Telephone Encounter (Signed)
Pt called stating he saw regina on Friday He would like to have regina to call him He wanted to let her know that he had his drug store send a rx request

## 2014-08-01 ENCOUNTER — Telehealth: Payer: Self-pay | Admitting: Nurse Practitioner

## 2014-08-01 ENCOUNTER — Telehealth: Payer: Self-pay

## 2014-08-01 NOTE — Telephone Encounter (Signed)
Spoke with patient and appointment made for 1 of 3 Hep B series on 08/06/14.  Pt aware he is to make 2 of 3 and 3 of 3 (Hep B series) appointments upon checking out.

## 2014-08-01 NOTE — Telephone Encounter (Signed)
Pt wanted to find if he can get hep series. Pt has already received ok from rheumatology. Please advise/msn

## 2014-08-01 NOTE — Telephone Encounter (Signed)
Tried to call patient.  Left message to return my call.

## 2014-08-06 ENCOUNTER — Ambulatory Visit (INDEPENDENT_AMBULATORY_CARE_PROVIDER_SITE_OTHER): Admitting: *Deleted

## 2014-08-06 DIAGNOSIS — Z23 Encounter for immunization: Secondary | ICD-10-CM | POA: Diagnosis not present

## 2014-08-13 ENCOUNTER — Ambulatory Visit

## 2014-09-09 ENCOUNTER — Ambulatory Visit (INDEPENDENT_AMBULATORY_CARE_PROVIDER_SITE_OTHER)

## 2014-09-09 DIAGNOSIS — Z1159 Encounter for screening for other viral diseases: Secondary | ICD-10-CM | POA: Diagnosis not present

## 2014-09-09 DIAGNOSIS — Z23 Encounter for immunization: Secondary | ICD-10-CM | POA: Diagnosis not present

## 2014-09-16 ENCOUNTER — Ambulatory Visit: Admitting: Nurse Practitioner

## 2015-02-26 ENCOUNTER — Ambulatory Visit (INDEPENDENT_AMBULATORY_CARE_PROVIDER_SITE_OTHER): Admitting: Internal Medicine

## 2015-02-26 ENCOUNTER — Encounter: Payer: Self-pay | Admitting: Internal Medicine

## 2015-02-26 VITALS — BP 118/76 | HR 52 | Temp 97.6°F | Wt 223.0 lb

## 2015-02-26 DIAGNOSIS — B370 Candidal stomatitis: Secondary | ICD-10-CM

## 2015-02-26 DIAGNOSIS — Z23 Encounter for immunization: Secondary | ICD-10-CM | POA: Diagnosis not present

## 2015-02-26 MED ORDER — NYSTATIN 100000 UNIT/ML MT SUSP: OROMUCOSAL | Status: DC

## 2015-02-26 NOTE — Progress Notes (Signed)
Subjective:    Patient ID: Samuel Crawford, male    DOB: Dec 16, 1950, 64 y.o.   MRN: [id number]  HPI  Pt presents to the clinic today with c/o a thick white coating and ulcerations on his bilateral cheeks. He noticed this 1 week ago. He thinks it is thrush. It is painful. He is taking Methotrexate for Lyme's disease and is being followed by rheumatology. He denies fever, chills, body aches or URI symptoms. He denies difficulty swallowing. He has not tried some leftover Nystatin with some improvement but not complete resolution. He would like a refill of Nystatin today.  He is also due for his 3rd Hep B vaccine. He would like to get it today.  Review of Systems      Past Medical History  Diagnosis Date  . Lyme disease   . Pulmonary embolism ([place])     as a result of a PICC line x 2, 1 year apart  . Hypertension   . Sleep apnea     CPAP 8     Current Outpatient Prescriptions  Medication Sig Dispense Refill  . folic acid (FOLVITE) 1 MG tablet Take 2 mg by mouth daily.    . hydrochlorothiazide (HYDRODIURIL) 25 MG tablet Take 1 tablet (25 mg total) by mouth daily. 30 tablet 11  . hydrocortisone valerate ointment (WESTCORT) 0.2 % Apply 1 application topically 2 (two) times daily. 45 g 1  . loratadine (CLARITIN) 10 MG tablet Take 10 mg by mouth daily.    . methotrexate (RHEUMATREX) 2.5 MG tablet Take 4 tablets by mouth once a week.    . Multiple Vitamin (MULTIVITAMIN) tablet Take 1 tablet by mouth daily.    Marland Kitchen nystatin (MYCOSTATIN) 100000 UNIT/ML suspension TAKE 5 MLS (500,000 UNITS TOTAL) BY MOUTH 4 (FOUR) TIMES DAILY. 120 mL 1   No current facility-administered medications for this visit.    Allergies  Allergen Reactions  . Amoxicillin Rash    Family History  Problem Relation Age of Onset  . Diabetes Mother   . Diabetes Maternal Grandmother     Social History   Social History  . Marital Status: Married    Spouse Name: N/A  . Number of Children: N/A  . Years of  Education: N/A   Occupational History  . Not on file.   Social History Main Topics  . Smoking status: Former Smoker    Quit date: 03/21/1986  . Smokeless tobacco: Never Used  . Alcohol Use: 0.0 oz/week    0 Standard drinks or equivalent per week     Comment: 1 beer a month  . Drug Use: No  . Sexual Activity: Not Currently   Other Topics Concern  . Not on file   Social History Narrative   Left handed    Graduate school    Retired from [employer]    Enjoys video games in free time    Lives at [place]: Denies fever, malaise, fatigue, headache or abrupt weight changes.  HEENT: Pt reports mouth pain. Denies eye pain, eye redness, ear pain, ringing in the ears, wax buildup, runny nose, nasal congestion, bloody nose, or sore throat. Respiratory: Denies difficulty breathing, shortness of breath, cough or sputum production.   Cardiovascular: Denies chest pain, chest tightness, palpitations or swelling in the hands or feet.   No other specific complaints in a complete review of systems (except as listed in HPI above).   Objective:   Physical Exam  BP  118/76 mmHg  Pulse 52  Temp(Src) 97.6 F (36.4 C) (Oral)  Wt 223 lb (101.152 kg)  SpO2 96% Wt Readings from Last 3 Encounters:  02/26/15 223 lb (101.152 kg)  07/25/14 216 lb 8 oz (98.204 kg)  07/09/14 213 lb 12.8 oz (96.979 kg)    General: Appears his stated age, well developed, well nourished in NAD. Skin: Warm, dry and intact. No rashes, lesions or ulcerations noted. HEENT: Head: normal shape and size; Throat/Mouth: Teeth present (he has had a lot of dental work), mucosa pink and moist, some white coating noted on the bilateral buccal mucosa.  Neck: No adenopathy noted.  Cardiovascular: Normal rate and rhythm. S1,S2 noted.  No murmur, rubs or gallops noted.  Pulmonary/Chest: Normal effort and positive vesicular breath sounds. No respiratory distress. No wheezes, rales or ronchi noted.      BMET    Component Value Date/Time   NA 137 05/07/2014   K 3.8 05/07/2014   BUN 18 05/07/2014   CREATININE 1.0 05/07/2014    Lipid Panel  No results found for: CHOL, TRIG, HDL, CHOLHDL, VLDL, LDLCALC  CBC    Component Value Date/Time   WBC 6.7 07/09/2014 0957   WBC 7.2 05/07/2014   RBC 4.84 07/09/2014 0957   HGB 15.9 07/09/2014 0957   HCT 45.8 07/09/2014 0957   PLT 219.0 07/09/2014 0957   MCV 94.7 07/09/2014 0957   MCHC 34.8 07/09/2014 0957   RDW 13.8 07/09/2014 0957   LYMPHSABS 1.4 07/09/2014 0957   MONOABS 0.5 07/09/2014 0957   EOSABS 0.1 07/09/2014 0957   BASOSABS 0.0 07/09/2014 0957    Hgb A1C No results found for: HGBA1C       Assessment & Plan:   Thrush:  eRx for Nystatin swish and swallow He is considering switching from MTX to a biologic, advised him to continue working with his rheumatologist for this  Need for Hep B vaccine today:  Hep B # 3 today  RTC as needed or if symptoms persist or worsen

## 2015-02-26 NOTE — Progress Notes (Signed)
Pre visit review using our clinic review tool, if applicable. No additional management support is needed unless otherwise documented below in the visit note. 

## 2015-02-26 NOTE — Patient Instructions (Signed)
Thrush, Adult  Thrush, also called oral candidiasis, is a fungal infection that develops in the mouth and throat and on the tongue. It causes white patches to form on the mouth and tongue. Thrush is most common in older adults, but it can occur at any age.   Many cases of thrush are mild, but this infection can also be more serious. Thrush can be a recurring problem for people who have chronic illnesses or who take medicines that limit the body's ability to fight infection. Because these people have difficulty fighting infections, the fungus that causes thrush can spread throughout the body. This can cause life-threatening blood or organ infections.  CAUSES   Thrush is usually caused by a yeast called Candida albicans. This fungus is normally present in small amounts in the mouth and on other mucous membranes. It usually causes no harm. However, when conditions are present that allow the fungus to grow uncontrolled, it invades surrounding tissues and becomes an infection. Less often, other Candida species can also lead to thrush.   RISK FACTORS  Thrush is more likely to develop in the following people:  · People with an impaired ability to fight infection (weakened immune system).    · Older adults.    · People with HIV.    · People with diabetes.    · People with dry mouth (xerostomia).    · Pregnant women.    · People with poor dental care, especially those who have false teeth.    · People who use antibiotic medicines.    SIGNS AND SYMPTOMS   Thrush can be a mild infection that causes no symptoms. If symptoms develop, they may include:   · A burning feeling in the mouth and throat. This can occur at the start of a thrush infection.    · White patches that adhere to the mouth and tongue. The tissue around the patches may be red, raw, and painful. If rubbed (during tooth brushing, for example), the patches and the tissue of the mouth may bleed easily.    · A bad taste in the mouth or difficulty tasting foods.     · Cottony feeling in the mouth.    · Pain during eating and swallowing.  DIAGNOSIS   Your health care provider can usually diagnose thrush by looking in your mouth and asking you questions about your health.   TREATMENT   Medicines that help prevent the growth of fungi (antifungals) are the standard treatment for thrush. These medicines are either applied directly to the affected area (topical) or swallowed (oral). The treatment will depend on the severity of the condition.   Mild Thrush  Mild cases of thrush may clear up with the use of an antifungal mouth rinse or lozenges. Treatment usually lasts about 14 days.   Moderate to Severe Thrush  · More severe thrush infections that have spread to the esophagus are treated with an oral antifungal medicine. A topical antifungal medicine may also be used.    · For some severe infections, a treatment period longer than 14 days may be needed.    · Oral antifungal medicines are almost never used during pregnancy because the fetus may be harmed. However, if a pregnant woman has a rare, severe thrush infection that has spread to her blood, oral antifungal medicines may be used. In this case, the risk of harm to the mother and fetus from the severe thrush infection may be greater than the risk posed by the use of antifungal medicines.    Persistent or Recurrent Thrush  For cases of   thrush that do not go away or keep coming back, treatment may involve the following:   · Treatment may be needed twice as long as the symptoms last.    · Treatment will include both oral and topical antifungal medicines.    · People with weakened immune systems can take an antifungal medicine on a continuous basis to prevent thrush infections.    It is important to treat conditions that make you more likely to get thrush, such as diabetes or HIV.   HOME CARE INSTRUCTIONS   · Only take over-the-counter or prescription medicine as directed by your health care provider. Talk to your health care  provider about an over-the-counter medicine called gentian violet, which kills bacteria and fungi.    · Eat plain, unflavored yogurt as directed by your health care provider. Check the label to make sure the yogurt contains live cultures. This yogurt can help healthy bacteria grow in the mouth that can stop the growth of the fungus that causes thrush.    · Try these measures to help reduce the discomfort of thrush:      Drink cold liquids such as water or iced tea.      Try flavored ice treats or frozen juices.      Eat foods that are easy to swallow, such as gelatin, ice cream, or custard.      If the patches in your mouth are painful, try drinking from a straw.    · Rinse your mouth several times a day with a warm saltwater rinse. You can make the saltwater mixture with 1 tsp (6 g) of salt in 8 fl oz (0.2 L) of warm water.    · If you wear dentures, remove the dentures before going to bed, brush them vigorously, and soak them in a cleaning solution as directed by your health care provider.    · Women who are breastfeeding should clean their nipples with an antifungal medicine as directed by their health care provider. Dry the nipples after breastfeeding. Applying lanolin-containing body lotion may help relieve nipple soreness.    SEEK MEDICAL CARE IF:  · Your symptoms are getting worse or are not improving within 7 days of starting treatment.    · You have symptoms of spreading infection, such as white patches on the skin outside of the mouth.    · You are nursing and you have redness, burning, or pain in the nipples that is not relieved with treatment.    MAKE SURE YOU:  · Understand these instructions.  · Will watch your condition.  · Will get help right away if you are not doing well or get worse.     This information is not intended to replace advice given to you by your health care provider. Make sure you discuss any questions you have with your health care provider.     Document Released: 12/01/2003 Document  Revised: 03/28/2014 Document Reviewed: 10/08/2012  Elsevier Interactive Patient Education ©2016 [employer]

## 2015-02-26 NOTE — Addendum Note (Signed)
Addended by: Lurlean Nanny on: 02/26/2015 09:28 AM   Modules accepted: Orders

## 2015-03-05 ENCOUNTER — Ambulatory Visit (INDEPENDENT_AMBULATORY_CARE_PROVIDER_SITE_OTHER): Admitting: Internal Medicine

## 2015-03-05 ENCOUNTER — Encounter: Payer: Self-pay | Admitting: Internal Medicine

## 2015-03-05 VITALS — BP 140/70 | HR 67 | Temp 98.6°F | Wt 225.0 lb

## 2015-03-05 DIAGNOSIS — B370 Candidal stomatitis: Secondary | ICD-10-CM

## 2015-03-05 DIAGNOSIS — J069 Acute upper respiratory infection, unspecified: Secondary | ICD-10-CM

## 2015-03-05 MED ORDER — HYDROCODONE-HOMATROPINE 5-1.5 MG/5ML PO SYRP
5.0000 mL | ORAL_SOLUTION | Freq: Three times a day (TID) | ORAL | Status: DC | PRN
Start: 2015-03-05 — End: 2015-03-10

## 2015-03-05 MED ORDER — NYSTATIN 100000 UNIT/ML MT SUSP: OROMUCOSAL | Status: DC

## 2015-03-05 MED ORDER — AZITHROMYCIN 250 MG PO TABS: ORAL_TABLET | ORAL | Status: DC

## 2015-03-05 NOTE — Progress Notes (Signed)
Pre visit review using our clinic review tool, if applicable. No additional management support is needed unless otherwise documented below in the visit note. 

## 2015-03-05 NOTE — Patient Instructions (Signed)

## 2015-03-05 NOTE — Progress Notes (Signed)
HPI  Pt presents to the clinic today with c/o runny nose, sore throat and cough. This started 4-5 days ago. He is blowing yellow mucous out of his nose. The cough is productive of yellow mucous. He reports he has run low grade fevers, had chills and body aches. He has tried Mucinex, Claritin, Afrin and Ibuprofen with some relief. He has had sick contacts diagnosed with bronchitis. He is UTD on flu and pneumovax. He does not smoke.  Review of Systems      Past Medical History  Diagnosis Date  . Lyme disease   . Pulmonary embolism ([place])     as a result of a PICC line x 2, 1 year apart  . Hypertension   . Sleep apnea     CPAP 8     Family History  Problem Relation Age of Onset  . Diabetes Mother   . Diabetes Maternal Grandmother     Social History   Social History  . Marital Status: Married    Spouse Name: N/A  . Number of Children: N/A  . Years of Education: N/A   Occupational History  . Not on file.   Social History Main Topics  . Smoking status: Former Smoker    Quit date: 03/21/1986  . Smokeless tobacco: Never Used  . Alcohol Use: 0.0 oz/week    0 Standard drinks or equivalent per week     Comment: 1 beer a month  . Drug Use: No  . Sexual Activity: Not Currently   Other Topics Concern  . Not on file   Social History Narrative   Left handed    Graduate school    Retired from [employer]    Enjoys video games in free time    Lives at [place]  . Amoxicillin Rash     Constitutional: Positive fatigue and fever. Denies headache, abrupt weight changes.  HEENT:  Positive runny nose, sore throat. Denies eye redness, eye pain, pressure behind the eyes, facial pain, nasal congestion, ear pain, ringing in the ears, wax buildup, or bloody nose. Respiratory: Positive cough. Denies difficulty breathing or shortness of breath.  Cardiovascular: Denies chest pain, chest tightness, palpitations or swelling in the hands or  feet.   No other specific complaints in a complete review of systems (except as listed in HPI above).  Objective:   BP 140/70 mmHg  Pulse 67  Temp(Src) 98.6 F (37 C) (Oral)  Wt 225 lb (102.059 kg)  SpO2 97% Wt Readings from Last 3 Encounters:  03/05/15 225 lb (102.059 kg)  02/26/15 223 lb (101.152 kg)  07/25/14 216 lb 8 oz (98.204 kg)     General: Appears his stated age, well developed, well nourished in NAD. HEENT: Head: normal shape and size, no sinus tenderness noted; Eyes: sclera white, no icterus, conjunctiva pink; Ears: Tm's pink but intact, distorted light reflex; Throat/Mouth: + PND. Teeth present, mucosa erythematous and moist, no exudate noted, no lesions or ulcerations noted.  Neck: No cervical lymphadenopathy.  Cardiovascular: Normal rate and rhythm. S1,S2 noted.  No murmur, rubs or gallops noted.  Pulmonary/Chest: Normal effort and positive vesicular breath sounds. No respiratory distress. No wheezes, rales or ronchi noted.      Assessment & Plan:   Upper Respiratory Infection:  Get some rest and drink plenty of water Do salt water gargles for the sore throat eRx for Azithromax x 5 days Rx for Hycodan cough syrup Nystatin refilled in  case he develops thrust  RTC as needed or if symptoms persist.

## 2015-03-10 ENCOUNTER — Encounter: Payer: Self-pay | Admitting: Internal Medicine

## 2015-03-10 ENCOUNTER — Ambulatory Visit (INDEPENDENT_AMBULATORY_CARE_PROVIDER_SITE_OTHER): Admitting: Internal Medicine

## 2015-03-10 VITALS — BP 128/68 | HR 56 | Temp 98.0°F | Wt 225.0 lb

## 2015-03-10 DIAGNOSIS — J069 Acute upper respiratory infection, unspecified: Secondary | ICD-10-CM | POA: Diagnosis not present

## 2015-03-10 MED ORDER — HYDROCODONE-HOMATROPINE 5-1.5 MG/5ML PO SYRP: 5.0000 mL | ORAL_SOLUTION | Freq: Three times a day (TID) | ORAL | Status: DC | PRN

## 2015-03-10 MED ORDER — DOXYCYCLINE HYCLATE 100 MG PO TABS: 100.0000 mg | ORAL_TABLET | Freq: Two times a day (BID) | ORAL | Status: DC

## 2015-03-10 NOTE — Patient Instructions (Signed)

## 2015-03-10 NOTE — Progress Notes (Signed)
HPI  Pt presents to the clinic today with c/o ongoing runny nose, sore throat and cough. This started 10 days days ago. He wast seen 12/15 for the same. He was given Azithromax and Hycodan. He reports he took all the medication given but his symptoms have not improved. He thinks it is because he is immunocompromised due to MTX therapy. He is blowing yellow mucous out of his nose. The cough is productive of yellow mucous. He reports he has run low grade fevers, had chills and body aches. He has tried Mucinex, Claritin, Afrin and Ibuprofen with some relief. He has had sick contacts diagnosed with bronchitis. He is UTD on flu and pneumovax. He does not smoke.  Review of Systems      Past Medical History  Diagnosis Date  . Lyme disease   . Pulmonary embolism ([place])     as a result of a PICC line x 2, 1 year apart  . Hypertension   . Sleep apnea     CPAP 8     Family History  Problem Relation Age of Onset  . Diabetes Mother   . Diabetes Maternal Grandmother     Social History   Social History  . Marital Status: Married    Spouse Name: N/A  . Number of Children: N/A  . Years of Education: N/A   Occupational History  . Not on file.   Social History Main Topics  . Smoking status: Former Smoker    Quit date: 03/21/1986  . Smokeless tobacco: Never Used  . Alcohol Use: 0.0 oz/week    0 Standard drinks or equivalent per week     Comment: 1 beer a month  . Drug Use: No  . Sexual Activity: Not Currently   Other Topics Concern  . Not on file   Social History Narrative   Left handed    Graduate school    Retired from [employer]    Enjoys video games in free time    Lives at [place]  . Amoxicillin Rash     Constitutional: Positive fatigue and fever. Denies headache, abrupt weight changes.  HEENT:  Positive runny nose, sore throat. Denies eye redness, eye pain, pressure behind the eyes, facial pain, nasal congestion, ear  pain, ringing in the ears, wax buildup, or bloody nose. Respiratory: Positive cough. Denies difficulty breathing or shortness of breath.  Cardiovascular: Denies chest pain, chest tightness, palpitations or swelling in the hands or feet.   No other specific complaints in a complete review of systems (except as listed in HPI above).  Objective:   BP 128/68 mmHg  Pulse 56  Temp(Src) 98 F (36.7 C) (Oral)  Wt 225 lb (102.059 kg) Wt Readings from Last 3 Encounters:  03/10/15 225 lb (102.059 kg)  03/05/15 225 lb (102.059 kg)  02/26/15 223 lb (101.152 kg)     General: Appears his stated age, well developed, well nourished in NAD. HEENT: Head: normal shape and size, no sinus tenderness noted; Eyes: sclera white, no icterus, conjunctiva pink; Ears: Tm's pink but intact, distorted light reflex; Throat/Mouth: + PND. Teeth present, mucosa erythematous and moist, no exudate noted, no lesions or ulcerations noted.  Neck: No cervical lymphadenopathy.  Cardiovascular: Normal rate and rhythm. S1,S2 noted.  No murmur, rubs or gallops noted.  Pulmonary/Chest: Normal effort and positive vesicular breath sounds.Coarse breath sounds when coughing. No respiratory distress. No wheezes, rales or ronchi noted.  Assessment & Plan:   Upper Respiratory Infection, unresolved:  Get some rest and drink plenty of water Do salt water gargles for the sore throat eRx for Doxycycline 100 mg BID x 10 days Refilled Hycodan cough syrup If persist, consider chest xray   RTC as needed or if symptoms persist.

## 2015-03-17 ENCOUNTER — Ambulatory Visit

## 2015-03-25 ENCOUNTER — Encounter: Payer: Self-pay | Admitting: Internal Medicine

## 2015-03-25 ENCOUNTER — Ambulatory Visit (INDEPENDENT_AMBULATORY_CARE_PROVIDER_SITE_OTHER): Admitting: Internal Medicine

## 2015-03-25 VITALS — BP 128/70 | HR 74 | Temp 98.0°F | Wt 226.0 lb

## 2015-03-25 DIAGNOSIS — J0100 Acute maxillary sinusitis, unspecified: Secondary | ICD-10-CM

## 2015-03-25 MED ORDER — CLARITHROMYCIN 500 MG PO TABS: 500.0000 mg | ORAL_TABLET | Freq: Two times a day (BID) | ORAL | Status: DC

## 2015-03-25 NOTE — Progress Notes (Signed)
HPI  Pt presents to the clinic today with c/o nasal congestion, ear fullness, post nasal drip, sore throat and cough. This started 3 weeks ago. He is not able to blow anything out of his nose. The cough is non productive. He reports he has had low grade fevers but denies chills or body aches. He was seen 12/15 for the same, given a zpack. He was seen 12/20, given doxycycline for unresolved URI. He reports he finished the Doxycycline and his symptoms are unchanged. He has tried Zyrtec, Afrin QD and Hycodan with minimal relief. He has not had sick contacts. He is on MTX and is getting ready to transition to Humira.  Review of Systems    Past Medical History  Diagnosis Date  . Lyme disease   . Pulmonary embolism ([place])     as a result of a PICC line x 2, 1 year apart  . Hypertension   . Sleep apnea     CPAP 8     Family History  Problem Relation Age of Onset  . Diabetes Mother   . Diabetes Maternal Grandmother     Social History   Social History  . Marital Status: Married    Spouse Name: N/A  . Number of Children: N/A  . Years of Education: N/A   Occupational History  . Not on file.   Social History Main Topics  . Smoking status: Former Smoker    Quit date: 03/21/1986  . Smokeless tobacco: Never Used  . Alcohol Use: 0.0 oz/week    0 Standard drinks or equivalent per week     Comment: 1 beer a month  . Drug Use: No  . Sexual Activity: Not Currently   Other Topics Concern  . Not on file   Social History Narrative   Left handed    Graduate school    Retired from [employer]    Enjoys video games in free time    Lives at [place]  . Amoxicillin Rash     Constitutional: Positive headache, fatigue and fever. Denies abrupt weight changes.  HEENT:  Positive facial pain, nasal congestion and sore throat. Denies eye redness, ear pain, ringing in the ears, wax buildup, runny nose or bloody nose. Respiratory: Positive  cough. Denies difficulty breathing or shortness of breath.  Cardiovascular: Denies chest pain, chest tightness, palpitations or swelling in the hands or feet.   No other specific complaints in a complete review of systems (except as listed in HPI above).  Objective:  BP 128/70 mmHg  Pulse 74  Temp(Src) 98 F (36.7 C) (Oral)  Wt 226 lb (102.513 kg)  SpO2 97%   General: Appears his stated age, well developed, well nourished in NAD. HEENT: Head: normal shape and size, maxillary sinus tenderness noted; Eyes: sclera white, no icterus, conjunctiva pink; Ears: Tm's gray and intact, normal light reflex; Nose: mucosa pink and moist, septum midline; Throat/Mouth: + PND. Teeth present, mucosa pink and moist, no exudate noted, no lesions or ulcerations noted.  Neck:  No adenopathy noted.  Cardiovascular: Normal rate and rhythm. S1,S2 noted.  No murmur, rubs or gallops noted.  Pulmonary/Chest: Normal effort and positive vesicular breath sounds. No respiratory distress. No wheezes, rales or ronchi noted.      Assessment & Plan:   Sinusitis  Can use a Neti Pot which can be purchased from your local drug store. Flonase 2 sprays each nostril for 3 days and then as  needed. I do not think this is infectious, mainly inflammation. I think he should be on Pred Taper but he refuses He insists that he is sick and needs an abx, if he doesn't get this cleared up, he will not be able to start Humira eRx for Biaxin BID x 10 days If no improvement, he will have to see ENT  RTC as needed or if symptoms persist.

## 2015-03-25 NOTE — Progress Notes (Signed)
Pre visit review using our clinic review tool, if applicable. No additional management support is needed unless otherwise documented below in the visit note. 

## 2015-03-26 ENCOUNTER — Encounter: Payer: Self-pay | Admitting: Internal Medicine

## 2015-03-26 NOTE — Patient Instructions (Signed)

## 2015-04-01 ENCOUNTER — Encounter: Payer: Self-pay | Admitting: Internal Medicine

## 2015-08-10 ENCOUNTER — Ambulatory Visit (INDEPENDENT_AMBULATORY_CARE_PROVIDER_SITE_OTHER): Payer: TRICARE For Life (TFL) | Admitting: Internal Medicine

## 2015-08-10 ENCOUNTER — Encounter: Payer: Self-pay | Admitting: Internal Medicine

## 2015-08-10 VITALS — BP 142/88 | HR 58 | Temp 98.4°F | Ht 69.0 in | Wt 226.0 lb

## 2015-08-10 DIAGNOSIS — Z86711 Personal history of pulmonary embolism: Secondary | ICD-10-CM

## 2015-08-10 DIAGNOSIS — Z9989 Dependence on other enabling machines and devices: Secondary | ICD-10-CM

## 2015-08-10 DIAGNOSIS — L4050 Arthropathic psoriasis, unspecified: Secondary | ICD-10-CM | POA: Diagnosis not present

## 2015-08-10 DIAGNOSIS — G4733 Obstructive sleep apnea (adult) (pediatric): Secondary | ICD-10-CM

## 2015-08-10 DIAGNOSIS — A6920 Lyme disease, unspecified: Secondary | ICD-10-CM | POA: Diagnosis not present

## 2015-08-10 DIAGNOSIS — I10 Essential (primary) hypertension: Secondary | ICD-10-CM

## 2015-08-10 MED ORDER — HYDROCORTISONE VALERATE 0.2 % EX OINT: 1.0000 "application " | TOPICAL_OINTMENT | Freq: Two times a day (BID) | CUTANEOUS | Status: DC

## 2015-08-10 MED ORDER — FOLIC ACID 1 MG PO TABS: 2.0000 mg | ORAL_TABLET | Freq: Every day | ORAL | Status: DC

## 2015-08-10 MED ORDER — HYDROCHLOROTHIAZIDE 25 MG PO TABS: 25.0000 mg | ORAL_TABLET | Freq: Every day | ORAL | Status: DC

## 2015-08-10 NOTE — Progress Notes (Signed)
HPI  Pt presents to the clinic today to establish care and for management of the conditions listed below. She is transferring care from Lorane Gell, NP.  Lyme's Disease: She follows with rheumatology, Dr. Jefm Bryant. She takes Humira, Folic Acid and Celebrex as prescribed.  History of PE: secondary to PICC lines. He was on anticoagulation for 1 year. He had a negative hematology workup.  HTN: BP well controlled on HCTZ. Her BP today is 142/88 but he reports he has not taken his medication today. There is no ECG on file to review.  OSA: He wears his CPAP as directed. He gets 8-10 hours of sleep at night and feels rested when he wakes up.  Seasonal Allergies: Worse in the Spring. She takes Zyrtec with good relief.  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 He uses Human resources officer as needed.  Flu: 2016 Tetanus: 2016 Pneumovax: 2014 Prevnar: 2014 Zostovax: 2015 Colon Screening: 2000 Vision Screening: as needed Dentist: as needed  Past Medical History  Diagnosis Date  . Lyme disease   . Pulmonary embolism ([place])     as a result of a PICC line x 2, 1 year apart  . Hypertension   . Sleep apnea     CPAP 8     Current Outpatient Prescriptions  Medication Sig Dispense Refill  . celecoxib (CELEBREX) 100 MG capsule Take 1 capsule by mouth 2 (two) times daily.  2  . cetirizine (ZYRTEC) 10 MG tablet Take 10 mg by mouth daily.    . folic acid (FOLVITE) 1 MG tablet Take 2 mg by mouth daily.    Marland Kitchen HUMIRA PEN 40 MG/0.8ML PNKT 40 mg every 14 (fourteen) days.     . hydrochlorothiazide (HYDRODIURIL) 25 MG tablet Take 1 tablet (25 mg total) by mouth daily. 30 tablet 11  . hydrocortisone valerate ointment (WESTCORT) 0.2 % Apply 1 application topically 2 (two) times daily. 45 g 1  . Multiple Vitamin (MULTIVITAMIN) tablet Take 1 tablet by mouth daily.    . Omega-3 Fatty Acids (FISH OIL) 1200 MG CAPS Take 2 capsules by mouth daily.     No current facility-administered medications for this  visit.    Allergies  Allergen Reactions  . Amoxicillin Rash    Family History  Problem Relation Age of Onset  . Diabetes Mother   . Diabetes Maternal Grandmother     Social History   Social History  . Marital Status: Married    Spouse Name: N/A  . Number of Children: N/A  . Years of Education: N/A   Occupational History  . Not on file.   Social History Main Topics  . Smoking status: Former Smoker    Quit date: 03/21/1986  . Smokeless tobacco: Never Used  . Alcohol Use: 0.0 oz/week    0 Standard drinks or equivalent per week     Comment: 1 beer a month  . Drug Use: No  . Sexual Activity: Not Currently   Other Topics Concern  . Not on file   Social History Narrative   Left handed    Graduate school    Retired from [employer]    Enjoys video games in free time    Lives at [place]:  Constitutional: Denies fever, malaise, fatigue, headache or abrupt weight changes.  HEENT: Denies eye pain, eye redness, ear pain, ringing in the ears, wax buildup, runny nose, nasal congestion, bloody nose, or sore throat. Respiratory: Denies difficulty breathing, shortness of  breath, cough or sputum production.   Cardiovascular: Denies chest pain, chest tightness, palpitations or swelling in the hands or feet.  Gastrointestinal: Denies abdominal pain, bloating, constipation, diarrhea or blood in the stool.  GU: Denies frequency, urgency, pain with urination, blood in urine, odor or discharge. Musculoskeletal: Denies decrease in range of motion, difficulty with gait, muscle pain or joint pain and swelling.  Skin: Denies redness, rashes, lesions or ulcercations.  Neurological: Denies dizziness, difficulty with memory, difficulty with speech or problems with balance and coordination.  Psych: Denies anxiety, depression, SI/HI.  No other specific complaints in a complete review of systems (except as listed in HPI above).  PE: BP 142/88 mmHg  Pulse 58   Temp(Src) 98.4 F (36.9 C) (Oral)  Ht 5\' 9"  (1.753 m)  Wt 226 lb (102.513 kg)  BMI 33.36 kg/m2  SpO2 97%  Wt Readings from Last 3 Encounters:  08/10/15 226 lb (102.513 kg)  03/25/15 226 lb (102.513 kg)  03/10/15 225 lb (102.059 kg)    General: Appears his stated age, obese in NAD. Skin: Dry and intact, no rashes noted. HEENT: Head: normal shape and size; Eyes: sclera white, no icterus, conjunctiva pink, PERRLA and EOMs intact; Ears: Tm's gray and intact, normal light reflex; Throat/Mouth: Teeth present, mucosa pink and moist, no lesions or ulcerations noted.  Cardiovascular: Normal rate and rhythm. S1,S2 noted.  No murmur, rubs or gallops noted. No JVD or BLE edema.  Pulmonary/Chest: Normal effort and positive vesicular breath sounds. No respiratory distress. No wheezes, rales or ronchi noted.  Neurological: Alert and oriented.  Psychiatric: Mood and affect normal. Behavior is normal. Judgment and thought content normal.     BMET    Component Value Date/Time   NA 137 05/07/2014   K 3.8 05/07/2014   BUN 18 05/07/2014   CREATININE 1.0 05/07/2014    Lipid Panel  No results found for: CHOL, TRIG, HDL, CHOLHDL, VLDL, LDLCALC  CBC    Component Value Date/Time   WBC 6.7 07/09/2014 0957   WBC 7.2 05/07/2014   RBC 4.84 07/09/2014 0957   HGB 15.9 07/09/2014 0957   HCT 45.8 07/09/2014 0957   PLT 219.0 07/09/2014 0957   MCV 94.7 07/09/2014 0957   MCHC 34.8 07/09/2014 0957   RDW 13.8 07/09/2014 0957   LYMPHSABS 1.4 07/09/2014 0957   MONOABS 0.5 07/09/2014 0957   EOSABS 0.1 07/09/2014 0957   BASOSABS 0.0 07/09/2014 0957    Hgb A1C No results found for: HGBA1C   Assessment and Plan:

## 2015-08-10 NOTE — Assessment & Plan Note (Signed)
Secondary to Lyme Disease Continue Celebrex and Research scientist (physical sciences) refilled today

## 2015-08-10 NOTE — Progress Notes (Signed)
Pre visit review using our clinic review tool, if applicable. No additional management support is needed unless otherwise documented below in the visit note. 

## 2015-08-10 NOTE — Assessment & Plan Note (Signed)
Follows with rheumatology On Celebrex, Humira and Folic Acid Folic Acid refilled today

## 2015-08-10 NOTE — Assessment & Plan Note (Signed)
Due to PICC line Negative hematology workup

## 2015-08-10 NOTE — Patient Instructions (Signed)
Hypertension Hypertension, commonly called high blood pressure, is when the force of blood pumping through your arteries is too strong. Your arteries are the blood vessels that carry blood from your heart throughout your body. A blood pressure reading consists of a higher number over a lower number, such as 110/72. The higher number (systolic) is the pressure inside your arteries when your heart pumps. The lower number (diastolic) is the pressure inside your arteries when your heart relaxes. Ideally you want your blood pressure below 120/80. Hypertension forces your heart to work harder to pump blood. Your arteries may become narrow or stiff. Having untreated or uncontrolled hypertension can cause heart attack, stroke, kidney disease, and other problems. RISK FACTORS Some risk factors for high blood pressure are controllable. Others are not.  Risk factors you cannot control include:   Race. You may be at higher risk if you are African American.  Age. Risk increases with age.  Gender. Men are at higher risk than women before age 45 years. After age 65, women are at higher risk than men. Risk factors you can control include:  Not getting enough exercise or physical activity.  Being overweight.  Getting too much fat, sugar, calories, or salt in your diet.  Drinking too much alcohol. SIGNS AND SYMPTOMS Hypertension does not usually cause signs or symptoms. Extremely high blood pressure (hypertensive crisis) may cause headache, anxiety, shortness of breath, and nosebleed. DIAGNOSIS To check if you have hypertension, your health care provider will measure your blood pressure while you are seated, with your arm held at the level of your heart. It should be measured at least twice using the same arm. Certain conditions can cause a difference in blood pressure between your right and left arms. A blood pressure reading that is higher than normal on one occasion does not mean that you need treatment. If  it is not clear whether you have high blood pressure, you may be asked to return on a different day to have your blood pressure checked again. Or, you may be asked to monitor your blood pressure at home for 1 or more weeks. TREATMENT Treating high blood pressure includes making lifestyle changes and possibly taking medicine. Living a healthy lifestyle can help lower high blood pressure. You may need to change some of your habits. Lifestyle changes may include:  Following the DASH diet. This diet is high in fruits, vegetables, and whole grains. It is low in salt, red meat, and added sugars.  Keep your sodium intake below 2,300 mg per day.  Getting at least 30-45 minutes of aerobic exercise at least 4 times per week.  Losing weight if necessary.  Not smoking.  Limiting alcoholic beverages.  Learning ways to reduce stress. Your health care provider may prescribe medicine if lifestyle changes are not enough to get your blood pressure under control, and if one of the following is true:  You are 18-59 years of age and your systolic blood pressure is above 140.  You are 60 years of age or older, and your systolic blood pressure is above 150.  Your diastolic blood pressure is above 90.  You have diabetes, and your systolic blood pressure is over 140 or your diastolic blood pressure is over 90.  You have kidney disease and your blood pressure is above 140/90.  You have heart disease and your blood pressure is above 140/90. Your personal target blood pressure may vary depending on your medical conditions, your age, and other factors. HOME CARE INSTRUCTIONS    Have your blood pressure rechecked as directed by your health care provider.   Take medicines only as directed by your health care provider. Follow the directions carefully. Blood pressure medicines must be taken as prescribed. The medicine does not work as well when you skip doses. Skipping doses also puts you at risk for  problems.  Do not smoke.   Monitor your blood pressure at home as directed by your health care provider. SEEK MEDICAL CARE IF:   You think you are having a reaction to medicines taken.  You have recurrent headaches or feel dizzy.  You have swelling in your ankles.  You have trouble with your vision. SEEK IMMEDIATE MEDICAL CARE IF:  You develop a severe headache or confusion.  You have unusual weakness, numbness, or feel faint.  You have severe chest or abdominal pain.  You vomit repeatedly.  You have trouble breathing. MAKE SURE YOU:   Understand these instructions.  Will watch your condition.  Will get help right away if you are not doing well or get worse.   This information is not intended to replace advice given to you by your health care provider. Make sure you discuss any questions you have with your health care provider.   Document Released: 03/07/2005 Document Revised: 07/22/2014 Document Reviewed: 12/28/2012 Elsevier Interactive Patient Education 2016 Elsevier Inc.  

## 2015-08-10 NOTE — Assessment & Plan Note (Signed)
Encouraged weight loss Continue CPAP 

## 2015-08-10 NOTE — Assessment & Plan Note (Addendum)
Controlled on HCTZ CMET reviewed Will get ECG with annual exam

## 2015-10-14 ENCOUNTER — Encounter: Payer: Self-pay | Admitting: Internal Medicine

## 2015-10-14 ENCOUNTER — Ambulatory Visit (INDEPENDENT_AMBULATORY_CARE_PROVIDER_SITE_OTHER): Payer: TRICARE For Life (TFL) | Admitting: Internal Medicine

## 2015-10-14 ENCOUNTER — Ambulatory Visit: Payer: Self-pay | Admitting: Internal Medicine

## 2015-10-14 VITALS — BP 134/80 | HR 61 | Temp 98.3°F | Ht 69.0 in | Wt 226.0 lb

## 2015-10-14 DIAGNOSIS — G4733 Obstructive sleep apnea (adult) (pediatric): Secondary | ICD-10-CM | POA: Diagnosis not present

## 2015-10-14 DIAGNOSIS — R6889 Other general symptoms and signs: Secondary | ICD-10-CM | POA: Diagnosis not present

## 2015-10-14 DIAGNOSIS — Z0001 Encounter for general adult medical examination with abnormal findings: Secondary | ICD-10-CM | POA: Diagnosis not present

## 2015-10-14 DIAGNOSIS — Z125 Encounter for screening for malignant neoplasm of prostate: Secondary | ICD-10-CM

## 2015-10-14 NOTE — Progress Notes (Signed)
Subjective:    Patient ID: Samuel Crawford, male    DOB: July 06, 1950, 65 y.o.   MRN: [id number]  HPI  Pt presents to the clinic today for his annual exam.  Flu: 12/2014 Tetanus: 04/2014 Pneumovax: 2014 Prevnar: 2015 Zostovax: 2015 Colon Screening: 2000 Vision Screening: as needed Dentist: as needed  Diet: He does eat meat. He consumes fruits and veggies daily. He tries to avoid fried foods. He drinks mostly water, tea. 1 soda per day. Exercise: He does 30-45 minutes of cardio of recumbent bike, tai chi daily  He is requesting new supplies for his CPAP machine.  Review of Systems      Past Medical History:  Diagnosis Date  . Hypertension   . Lyme disease   . Pulmonary embolism ([place])    as a result of a PICC line x 2, 1 year apart  . Sleep apnea    CPAP 8     Current Outpatient Prescriptions  Medication Sig Dispense Refill  . celecoxib (CELEBREX) 100 MG capsule Take 1 capsule by mouth 2 (two) times daily.  2  . cetirizine (ZYRTEC) 10 MG tablet Take 10 mg by mouth daily.    . folic acid (FOLVITE) 1 MG tablet Take 2 tablets (2 mg total) by mouth daily. 180 tablet 3  . HUMIRA PEN 40 MG/0.8ML PNKT 40 mg every 14 (fourteen) days.     . hydrochlorothiazide (HYDRODIURIL) 25 MG tablet Take 1 tablet (25 mg total) by mouth daily. 90 tablet 3  . hydrocortisone valerate ointment (WESTCORT) 0.2 % Apply 1 application topically 2 (two) times daily. 135 g 3  . Multiple Vitamin (MULTIVITAMIN) tablet Take 1 tablet by mouth daily.    . Omega-3 Fatty Acids (FISH OIL) 1200 MG CAPS Take 2 capsules by mouth daily.     No current facility-administered medications for this visit.     Allergies  Allergen Reactions  . Amoxicillin Rash    Family History  Problem Relation Age of Onset  . Diabetes Mother   . Diabetes Maternal Grandmother     Social History   Social History  . Marital status: Married    Spouse name: N/A  . Number of children: N/A  . Years of education: N/A    Occupational History  . Not on file.   Social History Main Topics  . Smoking status: Former Smoker    Quit date: 03/21/1986  . Smokeless tobacco: Never Used  . Alcohol use 0.0 oz/week     Comment: 1 beer a month  . Drug use: No  . Sexual activity: Not Currently   Other Topics Concern  . Not on file   Social History Narrative   Left handed    Graduate school    Retired from [employer]    Enjoys video games in free time    Lives at [place]: Denies fever, malaise, fatigue, headache or abrupt weight changes.  HEENT: Denies eye pain, eye redness, ear pain, ringing in the ears, wax buildup, runny nose, nasal congestion, bloody nose, or sore throat. Respiratory: Denies difficulty breathing, shortness of breath, cough or sputum production.   Cardiovascular: Denies chest pain, chest tightness, palpitations or swelling in the hands or feet.  Gastrointestinal: Denies abdominal pain, bloating, constipation, diarrhea or blood in the stool.  GU: Denies urgency, frequency, pain with urination, burning sensation, blood in urine, odor or discharge. Musculoskeletal: Denies decrease in range of motion, difficulty with gait, muscle pain or  joint pain and swelling.  Skin: Denies redness, rashes, lesions or ulcercations.  Neurological: Denies dizziness, difficulty with memory, difficulty with speech or problems with balance and coordination.  Psych: Denies anxiety, depression, SI/HI.  No other specific complaints in a complete review of systems (except as listed in HPI above).  Objective:   Physical Exam   BP 134/80 (BP Location: Right Arm, Patient Position: Sitting, Cuff Size: Large)   Pulse 61   Temp 98.3 F (36.8 C) (Oral)   Ht _0  (1.753 m)   Wt 226 lb (102.5 kg)   SpO2 97%   BMI 33.37 kg/m   Wt Readings from Last 3 Encounters:  08/10/15 226 lb (102.5 kg)  03/25/15 226 lb (102.5 kg)  03/10/15 225 lb (102.1 kg)    General: Appears his  stated age, obese in NAD. Skin: Warm, dry and intact.  HEENT: Head: normal shape and size; Eyes: sclera white, no icterus, conjunctiva pink, PERRLA and EOMs intact; Ears: Tm's gray and intact, normal light reflex; Throat/Mouth: Teeth present, mucosa pink and moist, no exudate, lesions or ulcerations noted.  Neck:  Neck supple, trachea midline. No masses, lumps or thyromegaly present.  Cardiovascular: Normal rate and rhythm. S1,S2 noted.  No murmur, rubs or gallops noted. No JVD or BLE edema. No carotid bruits noted. Pulmonary/Chest: Normal effort and positive vesicular breath sounds. No respiratory distress. No wheezes, rales or ronchi noted.  Abdomen: Soft and nontender. Normal bowel sounds. No distention or masses noted. Liver, spleen and kidneys non palpable. Musculoskeletal: Normal range of motion. Strength 5/5 BUE/BLE. No difficulty with gait.  Neurological: Alert and oriented. Cranial nerves II-XII grossly intact. Coordination normal.  Psychiatric: Mood and affect normal. Behavior is normal. Judgment and thought content normal.     BMET    Component Value Date/Time   NA 137 05/07/2014   K 3.8 05/07/2014   BUN 18 05/07/2014   CREATININE 1.0 05/07/2014    Lipid Panel  No results found for: CHOL, TRIG, HDL, CHOLHDL, VLDL, LDLCALC  CBC    Component Value Date/Time   WBC 6.7 07/09/2014 0957   RBC 4.84 07/09/2014 0957   HGB 15.9 07/09/2014 0957   HCT 45.8 07/09/2014 0957   PLT 219.0 07/09/2014 0957   MCV 94.7 07/09/2014 0957   MCHC 34.8 07/09/2014 0957   RDW 13.8 07/09/2014 0957   LYMPHSABS 1.4 07/09/2014 0957   MONOABS 0.5 07/09/2014 0957   EOSABS 0.1 07/09/2014 0957   BASOSABS 0.0 07/09/2014 0957    Hgb A1C No results found for: HGBA1C         Assessment & Plan:   Preventative Health Maintenance:  Encouraged him to get a flu shot in the fall. Tetanus, Pneumovax, Prevnar and Zostovax UTD He is past due for colonoscopy, he declines IFOB, colonoscopy or  Cologuard Encouraged him to consume a balanced diet and exercise regimen Encouraged him to see an eye doctor and dentist at least annually Will check CBC, CMET, Lipid, PSA today  RTC in 1 year, sooner if needed.

## 2015-10-14 NOTE — Addendum Note (Signed)
Addended by: Ellamae Sia on: 10/14/2015 03:15 PM   Modules accepted: Orders

## 2015-10-14 NOTE — Progress Notes (Signed)
Pre visit review using our clinic review tool, if applicable. No additional management support is needed unless otherwise documented below in the visit note. 

## 2015-10-14 NOTE — Patient Instructions (Signed)

## 2015-10-15 LAB — COMPREHENSIVE METABOLIC PANEL
A/G RATIO: 1.8 (ref 1.2–2.2)
ALT: 24 IU/L (ref 0–44)
AST: 26 IU/L (ref 0–40)
Albumin: 4.2 g/dL (ref 3.6–4.8)
Alkaline Phosphatase: 66 IU/L (ref 39–117)
BUN / CREAT RATIO: 15 (ref 10–24)
BUN: 14 mg/dL (ref 8–27)
Bilirubin Total: 0.5 mg/dL (ref 0.0–1.2)
CALCIUM: 9.9 mg/dL (ref 8.6–10.2)
CHLORIDE: 97 mmol/L (ref 96–106)
CO2: 27 mmol/L (ref 18–29)
Creatinine, Ser: 0.94 mg/dL (ref 0.76–1.27)
GFR, EST AFRICAN AMERICAN: 99 mL/min/{1.73_m2} (ref >59)
GFR, EST NON AFRICAN AMERICAN: 85 mL/min/{1.73_m2} (ref >59)
GLUCOSE: 103 mg/dL — AB (ref 65–99)
Globulin, Total: 2.4 g/dL (ref 1.5–4.5)
Potassium: 4.1 mmol/L (ref 3.5–5.2)
SODIUM: 142 mmol/L (ref 134–144)
Total Protein: 6.6 g/dL (ref 6.0–8.5)

## 2015-10-15 LAB — CBC WITH DIFFERENTIAL/PLATELET
BASOS ABS: 0 10*3/uL (ref 0.0–0.2)
Basos: 0 %
EOS (ABSOLUTE): 0.2 10*3/uL (ref 0.0–0.4)
EOS: 3 %
Hematocrit: 47.8 % (ref 37.5–51.0)
Hemoglobin: 16.4 g/dL (ref 12.6–17.7)
IMMATURE GRANULOCYTES: 0 %
Immature Grans (Abs): 0 10*3/uL (ref 0.0–0.1)
LYMPHS ABS: 2.7 10*3/uL (ref 0.7–3.1)
Lymphs: 34 %
MCH: 32.2 pg (ref 26.6–33.0)
MCHC: 34.3 g/dL (ref 31.5–35.7)
MCV: 94 fL (ref 79–97)
MONOS ABS: 0.6 10*3/uL (ref 0.1–0.9)
Monocytes: 7 %
NEUTROS PCT: 56 %
Neutrophils Absolute: 4.5 10*3/uL (ref 1.4–7.0)
PLATELETS: 220 10*3/uL (ref 150–379)
RBC: 5.1 x10E6/uL (ref 4.14–5.80)
RDW: 13.3 % (ref 12.3–15.4)
WBC: 8.1 10*3/uL (ref 3.4–10.8)

## 2015-10-15 LAB — LIPID PANEL
CHOLESTEROL TOTAL: 216 mg/dL — AB (ref 100–199)
Chol/HDL Ratio: 5.5 ratio units — ABNORMAL HIGH (ref 0.0–5.0)
HDL: 39 mg/dL — ABNORMAL LOW (ref >39)
LDL CALC: 133 mg/dL — AB (ref 0–99)
Triglycerides: 220 mg/dL — ABNORMAL HIGH (ref 0–149)
VLDL CHOLESTEROL CAL: 44 mg/dL — AB (ref 5–40)

## 2015-10-15 LAB — PSA: PROSTATE SPECIFIC AG, SERUM: 1.1 ng/mL (ref 0.0–4.0)

## 2015-12-03 ENCOUNTER — Encounter: Payer: Self-pay | Admitting: *Deleted

## 2015-12-04 NOTE — Discharge Instructions (Signed)

## 2015-12-07 ENCOUNTER — Ambulatory Visit: Admitting: Anesthesiology

## 2015-12-07 ENCOUNTER — Ambulatory Visit
Admission: RE | Admit: 2015-12-07 | Discharge: 2015-12-07 | Disposition: A | Discharge status: Home / Self Care | Source: Ambulatory Visit | Attending: Ophthalmology | Admitting: Ophthalmology

## 2015-12-07 ENCOUNTER — Encounter
Admission: RE | Disposition: A | Discharge status: Home / Self Care | Payer: Self-pay | Source: Ambulatory Visit | Attending: Ophthalmology

## 2015-12-07 DIAGNOSIS — L409 Psoriasis, unspecified: Secondary | ICD-10-CM | POA: Insufficient documentation

## 2015-12-07 DIAGNOSIS — H25041 Posterior subcapsular polar age-related cataract, right eye: Secondary | ICD-10-CM | POA: Diagnosis present

## 2015-12-07 DIAGNOSIS — M1990 Unspecified osteoarthritis, unspecified site: Secondary | ICD-10-CM | POA: Insufficient documentation

## 2015-12-07 DIAGNOSIS — I519 Heart disease, unspecified: Secondary | ICD-10-CM | POA: Diagnosis not present

## 2015-12-07 DIAGNOSIS — G4730 Sleep apnea, unspecified: Secondary | ICD-10-CM | POA: Insufficient documentation

## 2015-12-07 DIAGNOSIS — Z91013 Allergy to seafood: Secondary | ICD-10-CM | POA: Insufficient documentation

## 2015-12-07 DIAGNOSIS — J45909 Unspecified asthma, uncomplicated: Secondary | ICD-10-CM | POA: Diagnosis not present

## 2015-12-07 DIAGNOSIS — Z880 Allergy status to penicillin: Secondary | ICD-10-CM | POA: Diagnosis not present

## 2015-12-07 DIAGNOSIS — Z87891 Personal history of nicotine dependence: Secondary | ICD-10-CM | POA: Insufficient documentation

## 2015-12-07 DIAGNOSIS — I10 Essential (primary) hypertension: Secondary | ICD-10-CM | POA: Diagnosis not present

## 2015-12-07 HISTORY — PX: CATARACT EXTRACTION W/PHACO: SHX586

## 2015-12-07 HISTORY — DX: Unspecified osteoarthritis, unspecified site: M19.90

## 2015-12-07 HISTORY — DX: Presence of external hearing-aid: Z97.4

## 2015-12-07 HISTORY — DX: Personal history of other (healed) physical injury and trauma: Z87.828

## 2015-12-07 SURGERY — PHACOEMULSIFICATION, CATARACT, WITH IOL INSERTION
Anesthesia: Monitor Anesthesia Care | Laterality: Right | Wound class: Clean

## 2015-12-07 MED ORDER — MIDAZOLAM HCL 2 MG/2ML IJ SOLN
INTRAMUSCULAR | Status: DC | PRN
  Administered 2015-12-07: 2 mg via INTRAVENOUS

## 2015-12-07 MED ORDER — BRIMONIDINE TARTRATE 0.2 % OP SOLN
OPHTHALMIC | Status: DC | PRN
  Administered 2015-12-07: 1 [drp] via OPHTHALMIC

## 2015-12-07 MED ORDER — ARMC OPHTHALMIC DILATING GEL
1.0000 "application " | OPHTHALMIC | Status: DC | PRN
  Administered 2015-12-07 (×2): 1 via OPHTHALMIC

## 2015-12-07 MED ORDER — NA HYALUR & NA CHOND-NA HYALUR 0.4-0.35 ML IO KIT
PACK | INTRAOCULAR | Status: DC | PRN
Start: 2015-12-07 — End: 2015-12-07
  Administered 2015-12-07: 1 mL via INTRAOCULAR

## 2015-12-07 MED ORDER — POVIDONE-IODINE 5 % OP SOLN
1.0000 "application " | OPHTHALMIC | Status: DC | PRN
  Administered 2015-12-07: 1 via OPHTHALMIC

## 2015-12-07 MED ORDER — TETRACAINE HCL 0.5 % OP SOLN
1.0000 [drp] | OPHTHALMIC | Status: DC | PRN
  Administered 2015-12-07: 1 [drp] via OPHTHALMIC

## 2015-12-07 MED ORDER — LIDOCAINE HCL (PF) 4 % IJ SOLN
INTRAOCULAR | Status: DC | PRN
  Administered 2015-12-07: 1 mL via OPHTHALMIC

## 2015-12-07 MED ORDER — GLYCOPYRROLATE 0.2 MG/ML IJ SOLN
INTRAMUSCULAR | Status: DC | PRN
  Administered 2015-12-07: 0.2 mg via INTRAVENOUS

## 2015-12-07 MED ORDER — TIMOLOL MALEATE 0.5 % OP SOLN
OPHTHALMIC | Status: DC | PRN
  Administered 2015-12-07: 1 [drp] via OPHTHALMIC

## 2015-12-07 MED ORDER — FENTANYL CITRATE (PF) 100 MCG/2ML IJ SOLN
INTRAMUSCULAR | Status: DC | PRN
  Administered 2015-12-07: 100 ug via INTRAVENOUS

## 2015-12-07 MED ORDER — CEFUROXIME OPHTHALMIC INJECTION 1 MG/0.1 ML
INJECTION | OPHTHALMIC | Status: DC | PRN
  Administered 2015-12-07: 0.1 mL via INTRACAMERAL

## 2015-12-07 MED ORDER — BSS IO SOLN
INTRAOCULAR | Status: DC | PRN
  Administered 2015-12-07: 250 mL via OPHTHALMIC

## 2015-12-07 SURGICAL SUPPLY — 28 items
APPLICATOR COTTON TIP 3IN (MISCELLANEOUS) ×2 IMPLANT
CANNULA ANT/CHMB 27GA (MISCELLANEOUS) ×2 IMPLANT
DISSECTOR HYDRO NUCLEUS 50X22 (MISCELLANEOUS) ×2 IMPLANT
GLOVE BIO SURGEON STRL SZ7 (GLOVE) ×2 IMPLANT
GLOVE SURG LX 6.5 MICRO (GLOVE) ×1
GLOVE SURG LX STRL 6.5 MICRO (GLOVE) ×1 IMPLANT
GOWN STRL REUS W/ TWL LRG LVL3 (GOWN DISPOSABLE) ×2 IMPLANT
GOWN STRL REUS W/TWL LRG LVL3 (GOWN DISPOSABLE) ×2
LENS IOL ACRYSOF IQ 22.5 (Intraocular Lens) ×2 IMPLANT
MARKER SKIN DUAL TIP RULER LAB (MISCELLANEOUS) ×2 IMPLANT
NEEDLE FILTER BLUNT 18X 1/2SAF (NEEDLE) ×1
NEEDLE FILTER BLUNT 18X1 1/2 (NEEDLE) ×1 IMPLANT
PACK CATARACT BRASINGTON (MISCELLANEOUS) ×2 IMPLANT
PACK EYE AFTER SURG (MISCELLANEOUS) ×2 IMPLANT
PACK OPTHALMIC (MISCELLANEOUS) ×2 IMPLANT
RING MALYGIN 7.0 (MISCELLANEOUS) IMPLANT
SOL BAL SALT 15ML (MISCELLANEOUS)
SOLUTION BAL SALT 15ML (MISCELLANEOUS) IMPLANT
SUT ETHILON 10-0 CS-B-6CS-B-6 (SUTURE)
SUT VICRYL  9 0 (SUTURE)
SUT VICRYL 9 0 (SUTURE) IMPLANT
SUTURE EHLN 10-0 CS-B-6CS-B-6 (SUTURE) IMPLANT
SYR 3ML LL SCALE MARK (SYRINGE) ×2 IMPLANT
SYR TB 1ML LUER SLIP (SYRINGE) ×2 IMPLANT
WATER STERILE IRR 250ML POUR (IV SOLUTION) ×2 IMPLANT
WATER STERILE IRR 500ML POUR (IV SOLUTION) IMPLANT
WICK EYE OCUCEL (MISCELLANEOUS) IMPLANT
WIPE NON LINTING 3.25X3.25 (MISCELLANEOUS) ×2 IMPLANT

## 2015-12-07 NOTE — Transfer of Care (Signed)
Immediate Anesthesia Transfer of Care Note  Patient: Samuel Crawford  Procedure(s) Performed: Procedure(s) with comments: CATARACT EXTRACTION PHACO AND INTRAOCULAR LENS PLACEMENT (IOC) (Right) - RIGHT sleep apnea  Patient Location: PACU  Anesthesia Type: MAC  Level of Consciousness: awake, alert  and patient cooperative  Airway and Oxygen Therapy: Patient Spontanous Breathing and Patient connected to supplemental oxygen  Post-op Assessment: Post-op Vital signs reviewed, Patient's Cardiovascular Status Stable, Respiratory Function Stable, Patent Airway and No signs of Nausea or vomiting  Post-op Vital Signs: Reviewed and stable  Complications: No apparent anesthesia complications

## 2015-12-07 NOTE — Op Note (Signed)
Date of Surgery: 12/07/2015  PREOPERATIVE DIAGNOSES: Visually significant posterior subcapsular cataract, right eye.  POSTOPERATIVE DIAGNOSES: Same  PROCEDURES PERFORMED: Cataract extraction with intraocular lens implant, right eye.  SURGEON: Almon Hercules, M.D.  ANESTHESIA: MAC and topical  IMPLANTS: [id number] +22.5 D  Implant Name Type Inv. Item Serial No. Manufacturer Lot No. LRB No. Used  LENS IOL ACRYSOF IQ 22.5 - [id number] Intraocular Lens LENS IOL ACRYSOF IQ 22.5 [id number] ALCON   Right 1     COMPLICATIONS: None.  DESCRIPTION OF PROCEDURE: Therapeutic options were discussed with the patient preoperatively, including a discussion of risks and benefits of surgery. Informed consent was obtained. An IOL-Master and immersion biometry were used to take the lens measurements, and a dilated fundus exam was performed within 6 months of the surgical date.  The patient was premedicated and brought to the operating room and placed on the operating table in the supine position. After adequate anesthesia, the patient was prepped and draped in the usual sterile ophthalmic fashion. A wire lid speculum was inserted and the microscope was positioned. A Superblade was used to create a paracentesis site at the limbus and a small amount of dilute preservative free lidocaine was instilled into the anterior chamber, followed by dispersive viscoelastic. A clear corneal incision was created temporally using a 2.4 mm keratome blade. Capsulorrhexis was then performed. In situ phacoemulsification was performed.  Cortical material was removed with the irrigation-aspiration unit. Dispersive viscoelastic was instilled to open the capsular bag. A posterior chamber intraocular lens with the specifications above was inserted and positioned. Irrigation-aspiration was used to remove all viscoelastic. Cefuroxime 1cc was instilled into the anterior chamber, and the corneal incision was checked and found to be water  tight. The eyelid speculum was removed.  The operative eye was covered with protective goggles after instilling 1 drop of timolol and brimonidine. The patient tolerated the procedure well. There were no complications.

## 2015-12-07 NOTE — Anesthesia Preprocedure Evaluation (Signed)
Anesthesia Evaluation  Patient identified by MRN, date of birth, ID band Patient awake    Reviewed: Allergy & Precautions, H&P , NPO status , Patient's Chart, lab work & pertinent test results, reviewed documented beta blocker date and time   Airway Mallampati: II  TM Distance: >3 FB Neck ROM: full    Dental no notable dental hx.    Pulmonary sleep apnea and Continuous Positive Airway Pressure Ventilation , former smoker,    Pulmonary exam normal breath sounds clear to auscultation       Cardiovascular Exercise Tolerance: Good hypertension,  Rhythm:regular Rate:Normal     Neuro/Psych Lyme disease negative psych ROS   GI/Hepatic negative GI ROS, Neg liver ROS,   Endo/Other  negative endocrine ROS  Renal/GU negative Renal ROS  negative genitourinary   Musculoskeletal   Abdominal   Peds  Hematology negative hematology ROS (+)   Anesthesia Other Findings   Reproductive/Obstetrics negative OB ROS                             Anesthesia Physical Anesthesia Plan  ASA: II  Anesthesia Plan: MAC   Post-op Pain Management:    Induction:   Airway Management Planned:   Additional Equipment:   Intra-op Plan:   Post-operative Plan:   Informed Consent: I have reviewed the patients History and Physical, chart, labs and discussed the procedure including the risks, benefits and alternatives for the proposed anesthesia with the patient or authorized representative who has indicated his/her understanding and acceptance.   Dental Advisory Given  Plan Discussed with: CRNA  Anesthesia Plan Comments:         Anesthesia Quick Evaluation

## 2015-12-07 NOTE — H&P (Signed)
H+P reviewed and is up to date, please see paper chart.  

## 2015-12-07 NOTE — Anesthesia Procedure Notes (Signed)
Procedure Name: MAC Date/Time: 12/07/2015 9:32 AM Performed by: Janna Arch Pre-anesthesia Checklist: Patient identified, Emergency Drugs available, Suction available and Patient being monitored Patient Re-evaluated:Patient Re-evaluated prior to inductionOxygen Delivery Method: Nasal cannula

## 2015-12-07 NOTE — Anesthesia Postprocedure Evaluation (Signed)
Anesthesia Post Note  Patient: Samuel Crawford  Procedure(s) Performed: Procedure(s) (LRB): CATARACT EXTRACTION PHACO AND INTRAOCULAR LENS PLACEMENT (IOC) (Right)  Patient location during evaluation: PACU Anesthesia Type: MAC Level of consciousness: awake and alert Pain management: pain level controlled Vital Signs Assessment: post-procedure vital signs reviewed and stable Respiratory status: spontaneous breathing, nonlabored ventilation, respiratory function stable and patient connected to nasal cannula oxygen Cardiovascular status: stable and blood pressure returned to baseline Anesthetic complications: no    Alisa Graff

## 2015-12-08 ENCOUNTER — Encounter: Payer: Self-pay | Admitting: Ophthalmology

## 2015-12-08 ENCOUNTER — Encounter: Payer: Self-pay | Admitting: Internal Medicine

## 2016-01-18 ENCOUNTER — Encounter: Payer: Self-pay | Admitting: *Deleted

## 2016-01-20 ENCOUNTER — Encounter: Payer: Self-pay | Admitting: Internal Medicine

## 2016-01-20 ENCOUNTER — Ambulatory Visit (INDEPENDENT_AMBULATORY_CARE_PROVIDER_SITE_OTHER): Admitting: Internal Medicine

## 2016-01-20 VITALS — BP 136/82 | HR 76 | Temp 98.9°F | Wt 232.0 lb

## 2016-01-20 DIAGNOSIS — J301 Allergic rhinitis due to pollen: Secondary | ICD-10-CM

## 2016-01-20 MED ORDER — AZELASTINE HCL 0.1 % NA SOLN: 2.0000 | Freq: Two times a day (BID) | NASAL | 12 refills | Status: DC
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
## 2016-01-20 NOTE — Patient Instructions (Signed)
Allergic Rhinitis Allergic rhinitis is when the mucous membranes in the nose respond to allergens. Allergens are particles in the air that cause your body to have an allergic reaction. This causes you to release allergic antibodies. Through a chain of events, these eventually cause you to release histamine into the blood stream. Although meant to protect the body, it is this release of histamine that causes your discomfort, such as frequent sneezing, congestion, and an itchy, runny nose.  CAUSES Seasonal allergic rhinitis (hay fever) is caused by pollen allergens that may come from grasses, trees, and weeds. Year-round allergic rhinitis (perennial allergic rhinitis) is caused by allergens such as house dust mites, pet dander, and mold spores. SYMPTOMS  Nasal stuffiness (congestion).  Itchy, runny nose with sneezing and tearing of the eyes. DIAGNOSIS Your health care provider can help you determine the allergen or allergens that trigger your symptoms. If you and your health care provider are unable to determine the allergen, skin or blood testing may be used. Your health care provider will diagnose your condition after taking your health history and performing a physical exam. Your health care provider may assess you for other related conditions, such as asthma, pink eye, or an ear infection. TREATMENT Allergic rhinitis does not have a cure, but it can be controlled by:  Medicines that block allergy symptoms. These may include allergy shots, nasal sprays, and oral antihistamines.  Avoiding the allergen. Hay fever may often be treated with antihistamines in pill or nasal spray forms. Antihistamines block the effects of histamine. There are over-the-counter medicines that may help with nasal congestion and swelling around the eyes. Check with your health care provider before taking or giving this medicine. If avoiding the allergen or the medicine prescribed do not work, there are many new medicines  your health care provider can prescribe. Stronger medicine may be used if initial measures are ineffective. Desensitizing injections can be used if medicine and avoidance does not work. Desensitization is when a patient is given ongoing shots until the body becomes less sensitive to the allergen. Make sure you follow up with your health care provider if problems continue. HOME CARE INSTRUCTIONS It is not possible to completely avoid allergens, but you can reduce your symptoms by taking steps to limit your exposure to them. It helps to know exactly what you are allergic to so that you can avoid your specific triggers. SEEK MEDICAL CARE IF:  You have a fever.  You develop a cough that does not stop easily (persistent).  You have shortness of breath.  You start wheezing.  Symptoms interfere with normal daily activities.   This information is not intended to replace advice given to you by your health care provider. Make sure you discuss any questions you have with your health care provider.   Document Released: 11/30/2000 Document Revised: 03/28/2014 Document Reviewed: 11/12/2012 Elsevier Interactive Patient Education 2016 Elsevier Inc.  

## 2016-01-20 NOTE — Discharge Instructions (Signed)

## 2016-01-21 ENCOUNTER — Encounter: Payer: Self-pay | Admitting: Internal Medicine

## 2016-01-22 ENCOUNTER — Other Ambulatory Visit: Payer: Self-pay | Admitting: Internal Medicine

## 2016-01-22 ENCOUNTER — Ambulatory Visit: Payer: Self-pay | Admitting: Internal Medicine

## 2016-01-22 MED ORDER — DOXYCYCLINE HYCLATE 100 MG PO TABS: 100.0000 mg | ORAL_TABLET | Freq: Two times a day (BID) | ORAL | 0 refills | Status: DC

## 2016-01-24 NOTE — Anesthesia Preprocedure Evaluation (Addendum)
Anesthesia Evaluation  Patient identified by MRN, date of birth, ID band Patient awake    Reviewed: Allergy & Precautions, H&P , NPO status , Patient's Chart, lab work & pertinent test results, reviewed documented beta blocker date and time   History of Anesthesia Complications Negative for: history of anesthetic complications  Airway Mallampati: II  TM Distance: >3 FB Neck ROM: full    Dental no notable dental hx.    Pulmonary sleep apnea and Continuous Positive Airway Pressure Ventilation , former smoker, PE (from PICC line in 2010)   Pulmonary exam normal breath sounds clear to auscultation       Cardiovascular Exercise Tolerance: Good hypertension, Normal cardiovascular exam Rhythm:regular Rate:Normal     Neuro/Psych negative psych ROS   GI/Hepatic negative GI ROS, Neg liver ROS,   Endo/Other  negative endocrine ROS  Renal/GU negative Renal ROS  negative genitourinary   Musculoskeletal  (+) Arthritis  (from lyme dz),   Abdominal   Peds  Hematology negative hematology ROS (+)   Anesthesia Other Findings   Reproductive/Obstetrics negative OB ROS                            Anesthesia Physical Anesthesia Plan  ASA: II  Anesthesia Plan:    Post-op Pain Management:    Induction:   Airway Management Planned:   Additional Equipment:   Intra-op Plan:   Post-operative Plan:   Informed Consent: I have reviewed the patients History and Physical, chart, labs and discussed the procedure including the risks, benefits and alternatives for the proposed anesthesia with the patient or authorized representative who has indicated his/her understanding and acceptance.     Plan Discussed with: CRNA  Anesthesia Plan Comments:         Anesthesia Quick Evaluation                                   Anesthesia Evaluation  Patient identified by MRN, date of birth, ID band Patient  awake    Reviewed: Allergy & Precautions, H&P , NPO status , Patient's Chart, lab work & pertinent test results, reviewed documented beta blocker date and time   Airway Mallampati: II  TM Distance: >3 FB Neck ROM: full    Dental no notable dental hx.    Pulmonary sleep apnea and Continuous Positive Airway Pressure Ventilation , former smoker,    Pulmonary exam normal breath sounds clear to auscultation       Cardiovascular Exercise Tolerance: Good hypertension,  Rhythm:regular Rate:Normal     Neuro/Psych Lyme disease negative psych ROS   GI/Hepatic negative GI ROS, Neg liver ROS,   Endo/Other  negative endocrine ROS  Renal/GU negative Renal ROS  negative genitourinary   Musculoskeletal   Abdominal   Peds  Hematology negative hematology ROS (+)   Anesthesia Other Findings   Reproductive/Obstetrics negative OB ROS                             Anesthesia Physical Anesthesia Plan  ASA: II  Anesthesia Plan: MAC   Post-op Pain Management:    Induction:   Airway Management Planned:   Additional Equipment:   Intra-op Plan:   Post-operative Plan:   Informed Consent: I have reviewed the patients History and Physical, chart, labs and discussed the procedure including the risks, benefits and alternatives for  the proposed anesthesia with the patient or authorized representative who has indicated his/her understanding and acceptance.   Dental Advisory Given  Plan Discussed with: CRNA  Anesthesia Plan Comments:         Anesthesia Quick Evaluation

## 2016-01-25 ENCOUNTER — Ambulatory Visit: Admitting: Anesthesiology

## 2016-01-25 ENCOUNTER — Encounter
Admission: RE | Disposition: A | Discharge status: Home / Self Care | Payer: Self-pay | Source: Ambulatory Visit | Attending: Ophthalmology

## 2016-01-25 ENCOUNTER — Ambulatory Visit
Admission: RE | Admit: 2016-01-25 | Discharge: 2016-01-25 | Disposition: A | Discharge status: Home / Self Care | Source: Ambulatory Visit | Attending: Ophthalmology | Admitting: Ophthalmology

## 2016-01-25 DIAGNOSIS — I10 Essential (primary) hypertension: Secondary | ICD-10-CM | POA: Diagnosis not present

## 2016-01-25 DIAGNOSIS — H2512 Age-related nuclear cataract, left eye: Secondary | ICD-10-CM | POA: Insufficient documentation

## 2016-01-25 DIAGNOSIS — G4730 Sleep apnea, unspecified: Secondary | ICD-10-CM | POA: Insufficient documentation

## 2016-01-25 DIAGNOSIS — Z87891 Personal history of nicotine dependence: Secondary | ICD-10-CM | POA: Diagnosis not present

## 2016-01-25 DIAGNOSIS — M1990 Unspecified osteoarthritis, unspecified site: Secondary | ICD-10-CM | POA: Insufficient documentation

## 2016-01-25 HISTORY — PX: CATARACT EXTRACTION W/PHACO: SHX586

## 2016-01-25 SURGERY — PHACOEMULSIFICATION, CATARACT, WITH IOL INSERTION
Anesthesia: Topical | Laterality: Left | Wound class: Clean

## 2016-01-25 MED ORDER — TIMOLOL MALEATE 0.5 % OP SOLN
OPHTHALMIC | Status: DC | PRN
  Administered 2016-01-25: 1 [drp] via OPHTHALMIC

## 2016-01-25 MED ORDER — LIDOCAINE HCL (PF) 4 % IJ SOLN
INTRAOCULAR | Status: DC | PRN
  Administered 2016-01-25: 1.5 mL via OPHTHALMIC

## 2016-01-25 MED ORDER — NA HYALUR & NA CHOND-NA HYALUR 0.4-0.35 ML IO KIT
PACK | INTRAOCULAR | Status: DC | PRN
  Administered 2016-01-25: 1 mL via INTRAOCULAR

## 2016-01-25 MED ORDER — BRIMONIDINE TARTRATE 0.2 % OP SOLN
OPHTHALMIC | Status: DC | PRN
  Administered 2016-01-25: 1 [drp] via OPHTHALMIC

## 2016-01-25 MED ORDER — MOXIFLOXACIN HCL 0.5 % OP SOLN
OPHTHALMIC | Status: DC | PRN
  Administered 2016-01-25: .3 mL via OPHTHALMIC

## 2016-01-25 MED ORDER — ARMC OPHTHALMIC DILATING DROPS
1.0000 "application " | OPHTHALMIC | Status: DC | PRN
  Administered 2016-01-25 (×3): 1 via OPHTHALMIC

## 2016-01-25 MED ORDER — BSS IO SOLN
INTRAOCULAR | Status: DC | PRN
  Administered 2016-01-25: 68 mL via OPHTHALMIC

## 2016-01-25 MED ORDER — MOXIFLOXACIN HCL 0.5 % OP SOLN
1.0000 [drp] | OPHTHALMIC | Status: DC | PRN
  Administered 2016-01-25 (×3): 1 [drp] via OPHTHALMIC

## 2016-01-25 MED ORDER — FENTANYL CITRATE (PF) 100 MCG/2ML IJ SOLN
INTRAMUSCULAR | Status: DC | PRN
  Administered 2016-01-25: 100 ug via INTRAVENOUS

## 2016-01-25 MED ORDER — MIDAZOLAM HCL 2 MG/2ML IJ SOLN
INTRAMUSCULAR | Status: DC | PRN
  Administered 2016-01-25: 2 mg via INTRAVENOUS

## 2016-01-25 SURGICAL SUPPLY — 28 items
APPLICATOR COTTON TIP 3IN (MISCELLANEOUS) ×2 IMPLANT
CANNULA ANT/CHMB 27GA (MISCELLANEOUS) ×2 IMPLANT
DISSECTOR HYDRO NUCLEUS 50X22 (MISCELLANEOUS) ×2 IMPLANT
GLOVE BIO SURGEON STRL SZ7 (GLOVE) ×2 IMPLANT
GLOVE SURG LX 6.5 MICRO (GLOVE) ×1
GLOVE SURG LX STRL 6.5 MICRO (GLOVE) ×1 IMPLANT
GOWN STRL REUS W/ TWL LRG LVL3 (GOWN DISPOSABLE) ×2 IMPLANT
GOWN STRL REUS W/TWL LRG LVL3 (GOWN DISPOSABLE) ×2
LENS IOL ACRYSOF IQ 22.0 (Intraocular Lens) ×2 IMPLANT
MARKER SKIN DUAL TIP RULER LAB (MISCELLANEOUS) ×2 IMPLANT
NEEDLE FILTER BLUNT 18X 1/2SAF (NEEDLE) ×1
NEEDLE FILTER BLUNT 18X1 1/2 (NEEDLE) ×1 IMPLANT
PACK CATARACT BRASINGTON (MISCELLANEOUS) ×2 IMPLANT
PACK EYE AFTER SURG (MISCELLANEOUS) ×2 IMPLANT
PACK OPTHALMIC (MISCELLANEOUS) ×2 IMPLANT
RING MALYGIN 7.0 (MISCELLANEOUS) IMPLANT
SOL BAL SALT 15ML (MISCELLANEOUS)
SOLUTION BAL SALT 15ML (MISCELLANEOUS) IMPLANT
SUT ETHILON 10-0 CS-B-6CS-B-6 (SUTURE)
SUT VICRYL  9 0 (SUTURE)
SUT VICRYL 9 0 (SUTURE) IMPLANT
SUTURE EHLN 10-0 CS-B-6CS-B-6 (SUTURE) IMPLANT
SYR 3ML LL SCALE MARK (SYRINGE) ×2 IMPLANT
SYR TB 1ML LUER SLIP (SYRINGE) ×2 IMPLANT
WATER STERILE IRR 250ML POUR (IV SOLUTION) ×2 IMPLANT
WATER STERILE IRR 500ML POUR (IV SOLUTION) IMPLANT
WICK EYE OCUCEL (MISCELLANEOUS) IMPLANT
WIPE NON LINTING 3.25X3.25 (MISCELLANEOUS) ×2 IMPLANT

## 2016-01-25 NOTE — Op Note (Signed)
Date of Surgery: 01/25/2016  PREOPERATIVE DIAGNOSES: Visually significant nuclear sclerotic cataract, left eye.  POSTOPERATIVE DIAGNOSES: Same  PROCEDURES PERFORMED: Cataract extraction with intraocular lens implant, left eye.  SURGEON: Almon Hercules, M.D.  ANESTHESIA: MAC and topical  IMPLANTS: [id number] +22.0 D  Implant Name Type Inv. Item Serial No. Manufacturer Lot No. LRB No. Used  LENS IOL ACRYSOF IQ 22.0 - [id number] Intraocular Lens LENS IOL ACRYSOF IQ 22.0 [id number] ALCON [id number] Left 1    COMPLICATIONS: None.  DESCRIPTION OF PROCEDURE: Therapeutic options were discussed with the patient preoperatively, including a discussion of risks and benefits of surgery. Informed consent was obtained. An IOL-Master and immersion biometry were used to take the lens measurements, and a dilated fundus exam was performed within 6 months of the surgical date.  The patient was premedicated and brought to the operating room and placed on the operating table in the supine position. After adequate anesthesia, the patient was prepped and draped in the usual sterile ophthalmic fashion. A wire lid speculum was inserted and the microscope was positioned. A Superblade was used to create a paracentesis site at the limbus and a small amount of dilute preservative free lidocaine was instilled into the anterior chamber, followed by dispersive viscoelastic. A clear corneal incision was created temporally using a 2.4 mm keratome blade. Capsulorrhexis was then performed. In situ phacoemulsification was performed.  Cortical material was removed with the irrigation-aspiration unit. Dispersive viscoelastic was instilled to open the capsular bag. A posterior chamber intraocular lens with the specifications above was inserted and positioned. Irrigation-aspiration was used to remove all viscoelastic. Vigamox 1cc was instilled into the anterior chamber, and the corneal incision was checked and found to be water tight.  The eyelid speculum was removed.  The operative eye was covered with protective goggles after instilling 1 drop of timolol and brimonidine. The patient tolerated the procedure well. There were no complications.

## 2016-01-25 NOTE — Anesthesia Postprocedure Evaluation (Signed)
Anesthesia Post Note  Patient: Samuel Crawford  Procedure(s) Performed: Procedure(s) (LRB): CATARACT EXTRACTION PHACO AND INTRAOCULAR LENS PLACEMENT (IOC) (Left)  Patient location during evaluation: PACU Anesthesia Type: MAC Level of consciousness: awake and alert Pain management: pain level controlled Vital Signs Assessment: post-procedure vital signs reviewed and stable Respiratory status: spontaneous breathing, nonlabored ventilation, respiratory function stable and patient connected to nasal cannula oxygen Cardiovascular status: stable and blood pressure returned to baseline Anesthetic complications: no    Banks Chaikin

## 2016-01-25 NOTE — H&P (Signed)
H+P reviewed and is up to date, please see paper chart.  

## 2016-01-25 NOTE — Transfer of Care (Signed)
Immediate Anesthesia Transfer of Care Note  Patient: Samuel Crawford  Procedure(s) Performed: Procedure(s) with comments: CATARACT EXTRACTION PHACO AND INTRAOCULAR LENS PLACEMENT (IOC) (Left) - LEFT sleep apnea  Patient Location: PACU  Anesthesia Type: No value filed.  Level of Consciousness: awake, alert  and patient cooperative  Airway and Oxygen Therapy: Patient Spontanous Breathing and Patient connected to supplemental oxygen  Post-op Assessment: Post-op Vital signs reviewed, Patient's Cardiovascular Status Stable, Respiratory Function Stable, Patent Airway and No signs of Nausea or vomiting  Post-op Vital Signs: Reviewed and stable  Complications: No apparent anesthesia complications

## 2016-01-25 NOTE — Anesthesia Procedure Notes (Signed)
Procedure Name: MAC Performed by: Tran Randle Pre-anesthesia Checklist: Patient identified, Emergency Drugs available, Suction available, Timeout performed and Patient being monitored Patient Re-evaluated:Patient Re-evaluated prior to inductionOxygen Delivery Method: Nasal cannula Placement Confirmation: positive ETCO2       

## 2016-01-26 ENCOUNTER — Encounter: Payer: Self-pay | Admitting: Ophthalmology

## 2016-03-07 ENCOUNTER — Encounter: Payer: Self-pay | Admitting: Internal Medicine

## 2016-03-07 ENCOUNTER — Ambulatory Visit (INDEPENDENT_AMBULATORY_CARE_PROVIDER_SITE_OTHER): Admitting: Internal Medicine

## 2016-03-07 VITALS — BP 124/74 | HR 60 | Temp 98.5°F | Wt 234.0 lb

## 2016-03-07 DIAGNOSIS — B370 Candidal stomatitis: Secondary | ICD-10-CM

## 2016-03-07 MED ORDER — NYSTATIN 100000 UNIT/ML MT SUSP: 5.0000 mL | Freq: Four times a day (QID) | OROMUCOSAL | 2 refills | Status: DC

## 2016-03-07 NOTE — Patient Instructions (Signed)
Oral Thrush, Adult Oral thrush is an infection in your mouth and throat. It causes white patches on your tongue and in your mouth. Follow these instructions at home: Helping with soreness  To lessen your pain: ? Drink cold liquids, like water and iced tea. ? Eat frozen ice pops or frozen juices. ? Eat foods that are easy to swallow, like gelatin and ice cream. ? Drink from a straw if the patches in your mouth are painful. General instructions   Take or use over-the-counter and prescription medicines only as told by your doctor. Medicine for oral thrush may be something to swallow, or it may be something to put on the infected area.  Eat plain yogurt that has live cultures in it. Read the label to make sure.  If you wear dentures: ? Take out your dentures before you go to bed. ? Brush them well. ? Soak them in a denture cleaner.  Rinse your mouth with warm salt-water many times a day. To make the salt-water mixture, completely dissolve 1/2-1 teaspoon of salt in 1 cup of warm water. Contact a doctor if:  Your problems are getting worse.  Your problems do not get better in less than 7 days with treatment.  Your infection is spreading. This may show as white patches on the skin outside of your mouth.  You are nursing your baby and you have redness and pain in the nipples. This information is not intended to replace advice given to you by your health care provider. Make sure you discuss any questions you have with your health care provider. Document Released: 06/01/2009 Document Revised: 11/30/2015 Document Reviewed: 11/30/2015 Elsevier Interactive Patient Education  2017 Elsevier Inc.  
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
## 2016-07-01 ENCOUNTER — Other Ambulatory Visit: Payer: Self-pay | Admitting: Internal Medicine

## 2016-07-01 DIAGNOSIS — I10 Essential (primary) hypertension: Secondary | ICD-10-CM

## 2016-07-01 DIAGNOSIS — A6920 Lyme disease, unspecified: Secondary | ICD-10-CM

## 2016-08-23 DIAGNOSIS — Z961 Presence of intraocular lens: Secondary | ICD-10-CM | POA: Diagnosis not present

## 2016-09-16 DIAGNOSIS — M150 Primary generalized (osteo)arthritis: Secondary | ICD-10-CM | POA: Diagnosis not present

## 2016-09-16 DIAGNOSIS — L4050 Arthropathic psoriasis, unspecified: Secondary | ICD-10-CM | POA: Diagnosis not present

## 2016-09-28 ENCOUNTER — Other Ambulatory Visit: Payer: Self-pay | Admitting: Internal Medicine

## 2016-09-28 DIAGNOSIS — I10 Essential (primary) hypertension: Secondary | ICD-10-CM

## 2016-09-28 NOTE — Telephone Encounter (Signed)
CPE reminder letter mailed 

## 2016-10-06 ENCOUNTER — Telehealth: Payer: Self-pay

## 2016-10-06 NOTE — Telephone Encounter (Signed)
Samuel Crawford with [facility] left v/m; Samuel Crawford received the CMN about cpap supplies back with note to contact pulmonary provider; Samuel Crawford has no provider other than Avie Echevaria NP. Samuel Crawford does not understand; previously [facility] has signed other CMN's and an rx was sent to process the cpap supplies previously. Michelle request cb today; pt waiting on supplies.Please advise.

## 2016-10-07 NOTE — Telephone Encounter (Signed)
Called, but got VM... The form states they are requesting OV notes since 05/2016.... Also titration etc... Pt has not been seen let alone for sleep apnea and we do not monitor titration only pulmonology. Although we did fill out form last year for the supplies we did not have to send OV notes etc

## 2016-10-10 NOTE — Telephone Encounter (Signed)
Left detailed msg on VM per HIPAA  

## 2016-10-24 ENCOUNTER — Ambulatory Visit (INDEPENDENT_AMBULATORY_CARE_PROVIDER_SITE_OTHER): Payer: Medicare Other | Admitting: Internal Medicine

## 2016-10-24 ENCOUNTER — Encounter: Payer: Self-pay | Admitting: Internal Medicine

## 2016-10-24 VITALS — BP 126/80 | HR 65 | Temp 98.1°F | Ht 68.5 in | Wt 227.0 lb

## 2016-10-24 DIAGNOSIS — G4733 Obstructive sleep apnea (adult) (pediatric): Secondary | ICD-10-CM

## 2016-10-24 DIAGNOSIS — I158 Other secondary hypertension: Secondary | ICD-10-CM

## 2016-10-24 DIAGNOSIS — E789 Disorder of lipoprotein metabolism, unspecified: Secondary | ICD-10-CM | POA: Diagnosis not present

## 2016-10-24 DIAGNOSIS — L4050 Arthropathic psoriasis, unspecified: Secondary | ICD-10-CM | POA: Diagnosis not present

## 2016-10-24 DIAGNOSIS — Z8619 Personal history of other infectious and parasitic diseases: Secondary | ICD-10-CM

## 2016-10-24 DIAGNOSIS — Z0000 Encounter for general adult medical examination without abnormal findings: Secondary | ICD-10-CM | POA: Diagnosis not present

## 2016-10-24 DIAGNOSIS — R7309 Other abnormal glucose: Secondary | ICD-10-CM | POA: Diagnosis not present

## 2016-10-24 DIAGNOSIS — Z125 Encounter for screening for malignant neoplasm of prostate: Secondary | ICD-10-CM

## 2016-10-24 LAB — CBC
HEMATOCRIT: 47 % (ref 38.5–50.0)
Hemoglobin: 16.6 g/dL (ref 13.2–17.1)
MCH: 32.9 pg (ref 27.0–33.0)
MCHC: 35.3 g/dL (ref 32.0–36.0)
MCV: 93.3 fL (ref 80.0–100.0)
MPV: 11.2 fL (ref 7.5–12.5)
PLATELETS: 231 10*3/uL (ref 140–400)
RBC: 5.04 MIL/uL (ref 4.20–5.80)
RDW: 13.6 % (ref 11.0–15.0)
WBC: 7.7 10*3/uL (ref 3.8–10.8)

## 2016-10-24 NOTE — Assessment & Plan Note (Signed)
Continue Humira and Celebrex CMET today He will continue to follow with rheumatology

## 2016-10-24 NOTE — Assessment & Plan Note (Signed)
Controlled on HCTZ CMET today 

## 2016-10-24 NOTE — Patient Instructions (Signed)

## 2016-10-24 NOTE — Assessment & Plan Note (Signed)
He is requesting western blot today

## 2016-10-24 NOTE — Addendum Note (Signed)
Addended by: Ellamae Sia on: 10/24/2016 02:28 PM   Modules accepted: Orders
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
## 2016-10-24 NOTE — Assessment & Plan Note (Signed)
Referral to pulmonology placed today

## 2016-10-25 LAB — COMPREHENSIVE METABOLIC PANEL
ALT: 23 U/L (ref 9–46)
AST: 23 U/L (ref 10–35)
Albumin: 4.1 g/dL (ref 3.6–5.1)
Alkaline Phosphatase: 52 U/L (ref 40–115)
BILIRUBIN TOTAL: 0.7 mg/dL (ref 0.2–1.2)
BUN: 16 mg/dL (ref 7–25)
CALCIUM: 9.8 mg/dL (ref 8.6–10.3)
CO2: 28 mmol/L (ref 20–32)
Chloride: 98 mmol/L (ref 98–110)
Creat: 0.91 mg/dL (ref 0.70–1.25)
GLUCOSE: 101 mg/dL — AB (ref 65–99)
Potassium: 4 mmol/L (ref 3.5–5.3)
Sodium: 139 mmol/L (ref 135–146)
Total Protein: 6.4 g/dL (ref 6.1–8.1)

## 2016-10-25 LAB — LYME AB/WESTERN BLOT REFLEX

## 2016-10-25 LAB — LIPID PANEL
CHOL/HDL RATIO: 4.5 ratio (ref <5.0)
Cholesterol: 175 mg/dL (ref <200)
HDL: 39 mg/dL — AB (ref >40)
LDL CALC: 102 mg/dL — AB (ref <100)
Triglycerides: 172 mg/dL — ABNORMAL HIGH (ref <150)
VLDL: 34 mg/dL — ABNORMAL HIGH (ref <30)

## 2016-10-25 LAB — HEMOGLOBIN A1C
Hgb A1c MFr Bld: 5.7 % — ABNORMAL HIGH (ref <5.7)
MEAN PLASMA GLUCOSE: 117 mg/dL

## 2016-10-25 LAB — PSA: PSA: 0.7 ng/mL (ref <=4.0)

## 2016-11-24 ENCOUNTER — Encounter: Payer: Self-pay | Admitting: Internal Medicine

## 2016-11-24 ENCOUNTER — Ambulatory Visit (INDEPENDENT_AMBULATORY_CARE_PROVIDER_SITE_OTHER): Payer: Medicare Other | Admitting: Internal Medicine

## 2016-11-24 VITALS — BP 164/80 | HR 60 | Ht 68.5 in | Wt 230.0 lb

## 2016-11-24 DIAGNOSIS — G4733 Obstructive sleep apnea (adult) (pediatric): Secondary | ICD-10-CM | POA: Diagnosis not present

## 2016-11-24 NOTE — Patient Instructions (Signed)
--  Will send for home sleep test.
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
## 2016-12-12 ENCOUNTER — Encounter: Payer: Self-pay | Admitting: Internal Medicine

## 2016-12-12 ENCOUNTER — Other Ambulatory Visit: Payer: Self-pay | Admitting: Internal Medicine

## 2016-12-12 DIAGNOSIS — G4733 Obstructive sleep apnea (adult) (pediatric): Secondary | ICD-10-CM

## 2016-12-12 DIAGNOSIS — I10 Essential (primary) hypertension: Secondary | ICD-10-CM

## 2016-12-14 DIAGNOSIS — G4733 Obstructive sleep apnea (adult) (pediatric): Secondary | ICD-10-CM | POA: Diagnosis not present

## 2016-12-16 ENCOUNTER — Telehealth: Payer: Self-pay | Admitting: *Deleted

## 2016-12-16 DIAGNOSIS — G4733 Obstructive sleep apnea (adult) (pediatric): Secondary | ICD-10-CM

## 2016-12-16 NOTE — Telephone Encounter (Signed)
Pt informed he has OSA per HST. Order placed for CPAP machine. Nothing further needed.

## 2016-12-19 DIAGNOSIS — Z23 Encounter for immunization: Secondary | ICD-10-CM | POA: Diagnosis not present

## 2017-01-23 ENCOUNTER — Encounter: Payer: Self-pay | Admitting: Internal Medicine

## 2017-01-31 ENCOUNTER — Encounter: Payer: Self-pay | Admitting: Internal Medicine

## 2017-01-31 DIAGNOSIS — Z1212 Encounter for screening for malignant neoplasm of rectum: Secondary | ICD-10-CM | POA: Diagnosis not present

## 2017-01-31 DIAGNOSIS — Z1211 Encounter for screening for malignant neoplasm of colon: Secondary | ICD-10-CM | POA: Diagnosis not present

## 2017-01-31 LAB — COLOGUARD

## 2017-02-07 ENCOUNTER — Encounter: Payer: Self-pay | Admitting: Internal Medicine

## 2017-02-07 ENCOUNTER — Ambulatory Visit (INDEPENDENT_AMBULATORY_CARE_PROVIDER_SITE_OTHER): Payer: Medicare Other | Admitting: Internal Medicine

## 2017-02-07 VITALS — BP 140/86 | HR 51 | Resp 16 | Ht 68.0 in | Wt 229.0 lb

## 2017-02-07 DIAGNOSIS — G4733 Obstructive sleep apnea (adult) (pediatric): Secondary | ICD-10-CM | POA: Diagnosis not present

## 2017-02-07 NOTE — Patient Instructions (Addendum)
--  Continue using your cpap every night.  

## 2017-02-07 NOTE — Progress Notes (Signed)
[facility] Consultation      Assessment and Plan:  Severe obstructive sleep apnea. -Patient's machine and supplies are no longer covered, patient needs a new sleep study. -Currently on CPAP at a pressure level of 8, appears to be well tolerated, with good control of symptoms. -We'll send for new sleep study to requalify for device and supplies.  Date: 02/07/2017  MRN# [id number] Samuel Crawford 1950-04-26    Samuel Crawford is a 66 y.o. old male seen in consultation for chief complaint of:    Chief Complaint  Patient presents with  . Sleep Apnea    pt says cpap therapy is going great.    HPI:   The patient is 66 year old male, with a history of severe obstructive sleep apnea, severe. He typically goes to bed between 10 PM and midnight, he sleeps for 8-10 hours. He usually gets out of bed between 7 and 9 AM. At last visit it was noted that he was using an old machine and his supplies were not longer covered, he was sent for a new sleep study to requalify.   He has been started on his new cpap. He has been doing well with his CPAP, he is feeling rested during the day, not feeling sleepy and not snoring. He is using it about 6 to 8 hours night.   He has had a workup after the initial sleep study and is now noted to be ok. 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  Review of outside sleep studies; -Baseline sleep study. 05/27/2005, AHI was 40, increased to 52 when supine. -CPAP titration 08/06/2005; CPAP titrated to a pressure level of 8. Overall these tests show severe affective sleep apnea, adequately treated, at a CPAP level of 8  Medication:    Current Outpatient Medications:  .  celecoxib (CELEBREX) 100 MG capsule, Take 1 capsule by mouth 2 (two) times daily. Pt uses PRN, Disp: , Rfl: 2 .  cetirizine (ZYRTEC) 10 MG tablet, Take 10 mg by mouth daily., Disp: , Rfl:  .  folic acid (FOLVITE) 1 MG tablet, TAKE 2 TABLETS DAILY, Disp:  180 tablet, Rfl: 0 .  HUMIRA PEN 40 MG/0.8ML PNKT, 40 mg every 30 (thirty) days. , Disp: , Rfl:  .  hydrochlorothiazide (HYDRODIURIL) 25 MG tablet, Take 1 tablet (25 mg total) by mouth daily., Disp: 90 tablet, Rfl: 2 .  hydrocortisone valerate ointment (WESTCORT) 0.2 %, Apply 1 application topically 2 (two) times daily., Disp: 135 g, Rfl: 3 .  Multiple Vitamin (MULTIVITAMIN) tablet, Take 1 tablet by mouth daily., Disp: , Rfl:  .  Omega-3 Fatty Acids (FISH OIL) 1200 MG CAPS, Take 2 capsules by mouth daily., Disp: , Rfl:    Allergies:  Shellfish allergy; Amoxicillin; Betadine [povidone iodine]; and Other  Review of Systems: Gen:  Denies  fever, sweats, chills HEENT: Denies blurred vision, double vision. bleeds, sore throat Cvc:  No dizziness, chest pain. Resp:   Denies cough or sputum production, shortness of breath Gi: Denies swallowing difficulty, stomach pain. Gu:  Denies bladder incontinence, burning urine Ext:   No Joint pain, stiffness. Skin: No skin rash,  hives  Endoc:  No polyuria, polydipsia. Psych: No depression, insomnia. Other:  All other systems were reviewed with the patient and were negative other that what is mentioned in the HPI.   Physical Examination:   VS: BP 140/86 (BP Location: Left Arm, Cuff Size: Large)   Pulse Marland Kitchen)  51   Resp 16   Ht 5\' 8"  (1.727 m)   Wt 229 lb (103.9 kg)   SpO2 98%   BMI 34.82 kg/m   General Appearance: No distress  Neuro:without focal findings,  speech normal,  HEENT: PERRLA, EOM intact.   Pulmonary: normal breath sounds, No wheezing.  CardiovascularNormal S1,S2.  No m/r/g.   Abdomen: Benign, Soft, non-tender. Renal:  No costovertebral tenderness  GU:  No performed at this time. Endoc: No evident thyromegaly, no signs of acromegaly. Skin:   warm, no rashes, no ecchymosis  Extremities: normal, no cyanosis, clubbing.  Other findings:    LABORATORY PANEL:   CBC No results for input(s): WBC, HGB, HCT, PLT in the last 168  hours. ------------------------------------------------------------------------------------------------------------------  Chemistries  No results for input(s): NA, K, CL, CO2, GLUCOSE, BUN, CREATININE, CALCIUM, MG, AST, ALT, ALKPHOS, BILITOT in the last 168 hours.  Invalid input(s): GFRCGP ------------------------------------------------------------------------------------------------------------------  Cardiac Enzymes No results for input(s): TROPONINI in the last 168 hours. ------------------------------------------------------------  RADIOLOGY:  No results found.     Thank  you for the consultation and for allowing [facility] to assist in the care of your patient. Our recommendations are noted above.  Please contact us if we can be of further service.   Marda Stalker, MD.  Board Certified in Internal Medicine, Pulmonary Medicine, [place], and Sleep Medicine.  [facility] Office Number: [phone]  Patricia Pesa, M.D.  Merton Border, M.D  02/07/2017

## 2017-02-08 LAB — COLOGUARD: Cologuard: NEGATIVE

## 2017-02-10 ENCOUNTER — Encounter: Payer: Self-pay | Admitting: Internal Medicine

## 2017-02-20 ENCOUNTER — Encounter: Payer: Self-pay | Admitting: Internal Medicine

## 2017-02-20 ENCOUNTER — Telehealth: Payer: Self-pay | Admitting: *Deleted

## 2017-02-20 ENCOUNTER — Ambulatory Visit (INDEPENDENT_AMBULATORY_CARE_PROVIDER_SITE_OTHER): Payer: Medicare Other | Admitting: Family Medicine

## 2017-02-20 ENCOUNTER — Encounter: Payer: Self-pay | Admitting: Family Medicine

## 2017-02-20 ENCOUNTER — Ambulatory Visit
Admission: RE | Admit: 2017-02-20 | Discharge: 2017-02-20 | Disposition: A | Discharge status: Home / Self Care | Payer: Medicare Other | Source: Ambulatory Visit | Attending: Family Medicine | Admitting: Family Medicine

## 2017-02-20 VITALS — BP 120/76 | HR 53 | Temp 98.1°F | Ht 68.0 in | Wt 230.8 lb

## 2017-02-20 DIAGNOSIS — Z86718 Personal history of other venous thrombosis and embolism: Secondary | ICD-10-CM

## 2017-02-20 DIAGNOSIS — M79604 Pain in right leg: Secondary | ICD-10-CM | POA: Insufficient documentation

## 2017-02-20 NOTE — Telephone Encounter (Signed)
Called and spoke with patient regarding Doppler ultrasound, negative results.  He is currently on Celebrex, he can add acetaminophen, gentle massage, heat.  If not improved in 48 hours encouraged him to let me or his PCP know.

## 2017-02-20 NOTE — Patient Instructions (Signed)
Please see Samuel Crawford to schedule doppler study

## 2017-02-20 NOTE — Telephone Encounter (Signed)
Copied from Jane [id number]. Topic: General - Other >> Feb 20, 2017  3:34 PM Marin Olp L wrote: Reason for CRM: Patient returning call RE Ultrasound report.

## 2017-02-20 NOTE — Telephone Encounter (Signed)
Received call report that ultrasound for DVT is negative and report is in Epic. Advised patient to go home and the office will give him a call.
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
## 2017-02-20 NOTE — Telephone Encounter (Signed)
Routing message to provider as she wishes to speak with patient directly.

## 2017-02-20 NOTE — Telephone Encounter (Signed)
I attempted to call the patient on his mobile and home phone numbers and did not get an answer.  I left a callback number for him to call the office to discuss negative results.  Please see result note for further details.

## 2017-02-21 ENCOUNTER — Encounter: Payer: Self-pay | Admitting: Family Medicine

## 2017-02-23 ENCOUNTER — Ambulatory Visit (INDEPENDENT_AMBULATORY_CARE_PROVIDER_SITE_OTHER)
Admission: RE | Admit: 2017-02-23 | Discharge: 2017-02-23 | Disposition: A | Discharge status: Home / Self Care | Payer: Medicare Other | Source: Ambulatory Visit | Attending: Internal Medicine | Admitting: Internal Medicine

## 2017-02-23 ENCOUNTER — Encounter: Payer: Self-pay | Admitting: Internal Medicine

## 2017-02-23 ENCOUNTER — Ambulatory Visit (INDEPENDENT_AMBULATORY_CARE_PROVIDER_SITE_OTHER): Payer: Medicare Other | Admitting: Internal Medicine

## 2017-02-23 VITALS — BP 138/84 | HR 52 | Temp 98.4°F | Wt 232.0 lb

## 2017-02-23 DIAGNOSIS — M5442 Lumbago with sciatica, left side: Secondary | ICD-10-CM | POA: Diagnosis not present

## 2017-02-23 DIAGNOSIS — M5441 Lumbago with sciatica, right side: Secondary | ICD-10-CM | POA: Diagnosis not present

## 2017-02-23 DIAGNOSIS — M545 Low back pain: Secondary | ICD-10-CM | POA: Diagnosis not present

## 2017-02-23 MED ORDER — PREDNISONE 10 MG PO TABS: ORAL_TABLET | ORAL | 0 refills | Status: DC

## 2017-02-24 ENCOUNTER — Encounter: Payer: Self-pay | Admitting: Internal Medicine

## 2017-02-24 DIAGNOSIS — M5441 Lumbago with sciatica, right side: Secondary | ICD-10-CM

## 2017-02-24 DIAGNOSIS — M5442 Lumbago with sciatica, left side: Principal | ICD-10-CM

## 2017-02-24 NOTE — Patient Instructions (Signed)
Sciatica Sciatica is pain, numbness, weakness, or tingling along your sciatic nerve. The sciatic nerve starts in the lower back and goes down the back of each leg. Sciatica happens when this nerve is pinched or has pressure put on it. Sciatica usually goes away on its own or with treatment. Sometimes, sciatica may keep coming back (recur). Follow these instructions at home: Medicines  Take over-the-counter and prescription medicines only as told by your doctor.  Do not drive or use heavy machinery while taking prescription pain medicine. Managing pain  If directed, put ice on the affected area. ? Put ice in a plastic bag. ? Place a towel between your skin and the bag. ? Leave the ice on for 20 minutes, 2-3 times a day.  After icing, apply heat to the affected area before you exercise or as often as told by your doctor. Use the heat source that your doctor tells you to use, such as a moist heat pack or a heating pad. ? Place a towel between your skin and the heat source. ? Leave the heat on for 20-30 minutes. ? Remove the heat if your skin turns bright red. This is especially important if you are unable to feel pain, heat, or cold. You may have a greater risk of getting burned. Activity  Return to your normal activities as told by your doctor. Ask your doctor what activities are safe for you. ? Avoid activities that make your sciatica worse.  Take short rests during the day. Rest in a lying or standing position. This is usually better than sitting to rest. ? When you rest for a long time, do some physical activity or stretching between periods of rest. ? Avoid sitting for a long time without moving. Get up and move around at least one time each hour.  Exercise and stretch regularly, as told by your doctor.  Do not lift anything that is heavier than 10 lb (4.5 kg) while you have symptoms of sciatica. ? Avoid lifting heavy things even when you do not have symptoms. ? Avoid lifting heavy  things over and over.  When you lift objects, always lift in a way that is safe for your body. To do this, you should: ? Bend your knees. ? Keep the object close to your body. ? Avoid twisting. General instructions  Use good posture. ? Avoid leaning forward when you are sitting. ? Avoid hunching over when you are standing.  Stay at a healthy weight.  Wear comfortable shoes that support your feet. Avoid wearing high heels.  Avoid sleeping on a mattress that is too soft or too hard. You might have less pain if you sleep on a mattress that is firm enough to support your back.  Keep all follow-up visits as told by your doctor. This is important. Contact a doctor if:  You have pain that: ? Wakes you up when you are sleeping. ? Gets worse when you lie down. ? Is worse than the pain you have had in the past. ? Lasts longer than 4 weeks.  You lose weight for without trying. Get help right away if:  You cannot control when you pee (urinate) or poop (have a bowel movement).  You have weakness in any of these areas and it gets worse. ? Lower back. ? Lower belly (pelvis). ? Butt (buttocks). ? Legs.  You have redness or swelling of your back.  You have a burning feeling when you pee. This information is not intended to replace   advice given to you by your health care provider. Make sure you discuss any questions you have with your health care provider. Document Released: 12/15/2007 Document Revised: 08/13/2015 Document Reviewed: 11/14/2014 Elsevier Interactive Patient Education  2018 [employer]  
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
## 2017-02-25 ENCOUNTER — Encounter: Payer: Self-pay | Admitting: Internal Medicine

## 2017-02-27 ENCOUNTER — Encounter: Payer: Self-pay | Admitting: Internal Medicine

## 2017-03-07 ENCOUNTER — Telehealth: Payer: Self-pay

## 2017-03-07 ENCOUNTER — Encounter: Payer: Self-pay | Admitting: Internal Medicine

## 2017-03-07 NOTE — Telephone Encounter (Signed)
Cologuard (DNA stool test) result:  NEGATIVE A negative result indicates a lower likelihood that colorectal cancer or pre-cancer is present.  Pt is aware

## 2017-03-10 ENCOUNTER — Ambulatory Visit
Admission: RE | Admit: 2017-03-10 | Discharge: 2017-03-10 | Disposition: A | Discharge status: Home / Self Care | Payer: Medicare Other | Source: Ambulatory Visit | Attending: Internal Medicine | Admitting: Internal Medicine

## 2017-03-10 DIAGNOSIS — M4316 Spondylolisthesis, lumbar region: Secondary | ICD-10-CM | POA: Insufficient documentation

## 2017-03-10 DIAGNOSIS — M5442 Lumbago with sciatica, left side: Secondary | ICD-10-CM

## 2017-03-10 DIAGNOSIS — M5116 Intervertebral disc disorders with radiculopathy, lumbar region: Secondary | ICD-10-CM | POA: Diagnosis not present

## 2017-03-10 DIAGNOSIS — M545 Low back pain: Secondary | ICD-10-CM | POA: Diagnosis not present

## 2017-03-10 DIAGNOSIS — M7138 Other bursal cyst, other site: Secondary | ICD-10-CM | POA: Insufficient documentation

## 2017-03-10 DIAGNOSIS — M1288 Other specific arthropathies, not elsewhere classified, other specified site: Secondary | ICD-10-CM | POA: Diagnosis not present

## 2017-03-10 DIAGNOSIS — M5441 Lumbago with sciatica, right side: Secondary | ICD-10-CM

## 2017-03-10 DIAGNOSIS — M48061 Spinal stenosis, lumbar region without neurogenic claudication: Secondary | ICD-10-CM | POA: Diagnosis not present

## 2017-03-15 DIAGNOSIS — M545 Low back pain: Secondary | ICD-10-CM | POA: Diagnosis not present

## 2017-03-17 ENCOUNTER — Encounter: Payer: Self-pay | Admitting: Internal Medicine

## 2017-03-17 DIAGNOSIS — M545 Low back pain: Secondary | ICD-10-CM | POA: Diagnosis not present

## 2017-03-17 DIAGNOSIS — M5441 Lumbago with sciatica, right side: Secondary | ICD-10-CM

## 2017-03-17 DIAGNOSIS — M5442 Lumbago with sciatica, left side: Principal | ICD-10-CM

## 2017-03-20 DIAGNOSIS — M545 Low back pain: Secondary | ICD-10-CM | POA: Diagnosis not present

## 2017-03-22 DIAGNOSIS — M545 Low back pain: Secondary | ICD-10-CM | POA: Diagnosis not present

## 2017-03-28 DIAGNOSIS — M545 Low back pain: Secondary | ICD-10-CM | POA: Diagnosis not present

## 2017-03-30 DIAGNOSIS — M545 Low back pain: Secondary | ICD-10-CM | POA: Diagnosis not present

## 2017-04-04 DIAGNOSIS — M545 Low back pain: Secondary | ICD-10-CM | POA: Diagnosis not present

## 2017-04-06 DIAGNOSIS — L400 Psoriasis vulgaris: Secondary | ICD-10-CM | POA: Diagnosis not present

## 2017-04-06 DIAGNOSIS — M545 Low back pain: Secondary | ICD-10-CM | POA: Diagnosis not present

## 2017-04-06 DIAGNOSIS — X32XXXA Exposure to sunlight, initial encounter: Secondary | ICD-10-CM | POA: Diagnosis not present

## 2017-04-06 DIAGNOSIS — L570 Actinic keratosis: Secondary | ICD-10-CM | POA: Diagnosis not present

## 2017-04-06 DIAGNOSIS — L821 Other seborrheic keratosis: Secondary | ICD-10-CM | POA: Diagnosis not present

## 2017-04-09 ENCOUNTER — Encounter: Payer: Self-pay | Admitting: Internal Medicine

## 2017-04-10 NOTE — Progress Notes (Signed)
[facility] Consultation      Assessment and Plan:  Obstructive sleep apnea. -Currently on auto CPAP with pressure range 5-20, appears to be well tolerated, with good control of symptoms. -Continue CPAP use every night.  History of pulmonary embolism. -Patient developed a DVT from PICC line, then developed PE. - Is now off of anticoagulation.  Date: 04/10/2017  MRN# [id number] Alden Bensinger [birth year]    Samuel Crawford is a 67 y.o. old male seen in consultation for chief complaint of:    Chief Complaint  Patient presents with  . Sleep Apnea    Pt here for f/u he says he is doing well.    HPI:   The patient is 67 year old male, with a history of obstructive sleep apnea. He continues to do well CPAP, he is using it every night, he is more awake during the day.   Download data review, 30 days as of 04/09/17.  Usage is 30/30 days.  Average usage on days used is 7 hours 30 minutes.  Setting is auto, 5-20.  Median pressure is 8.1, 95th percentile pressure is 11.6, maximum pressure is 13.  Residual AHI is 3.8.  Review of outside sleep studies; -Home sleep study 12/12/16; AHI of 24. -Baseline sleep study. 05/27/2005, AHI was 40, increased to 52 when supine. -CPAP titration 08/06/2005; CPAP titrated to a pressure level of 8. Overall these tests show severe affective sleep apnea, adequately treated, at a CPAP level of 8  Medication:    Current Outpatient Medications:  .  celecoxib (CELEBREX) 100 MG capsule, Take 1 capsule by mouth 2 (two) times daily. Pt uses PRN, Disp: , Rfl: 2 .  cetirizine (ZYRTEC) 10 MG tablet, Take 10 mg by mouth daily., Disp: , Rfl:  .  folic acid (FOLVITE) 1 MG tablet, TAKE 2 TABLETS DAILY, Disp: 180 tablet, Rfl: 0 .  HUMIRA PEN 40 MG/0.8ML PNKT, 40 mg every 30 (thirty) days. , Disp: , Rfl:  .  hydrochlorothiazide (HYDRODIURIL) 25 MG tablet, Take 1 tablet (25 mg total) by mouth daily., Disp: 90 tablet, Rfl: 2 .  hydrocortisone valerate  ointment (WESTCORT) 0.2 %, Apply 1 application topically 2 (two) times daily., Disp: 135 g, Rfl: 3 .  Multiple Vitamin (MULTIVITAMIN) tablet, Take 1 tablet by mouth daily., Disp: , Rfl:  .  Omega-3 Fatty Acids (FISH OIL) 1200 MG CAPS, Take 2 capsules by mouth daily., Disp: , Rfl:  .  predniSONE (DELTASONE) 10 MG tablet, Take 3 tabs on days 1-3, take 2 tabs on days 4-6, take 1 tab on days 7-9, Disp: 18 tablet, Rfl: 0   Allergies:  Shellfish allergy; Amoxicillin; Betadine [povidone iodine]; and Other  Review of Systems: Gen:  Denies  fever, sweats, chills HEENT: Denies blurred vision, double vision. bleeds, sore throat Cvc:  No dizziness, chest pain. Resp:   Denies cough or sputum production, shortness of breath Gi: Denies swallowing difficulty, stomach pain. Gu:  Denies bladder incontinence, burning urine Ext:   No Joint pain, stiffness. Skin: No skin rash,  hives  Endoc:  No polyuria, polydipsia. Psych: No depression, insomnia. Other:  All other systems were reviewed with the patient and were negative other that what is mentioned in the HPI.   Physical Examination:   VS: BP 130/62 (BP Location: Left Arm, Cuff Size: Large)   Pulse 91   Resp 16   Ht 5\' 8"  (1.727 m)   Wt 232 lb (105.2 kg)   SpO2 98%   BMI 35.28 kg/m  General Appearance: No distress  Neuro:without focal findings,  speech normal,  HEENT: PERRLA, EOM intact.   Pulmonary: normal breath sounds, No wheezing.  CardiovascularNormal S1,S2.  No m/r/g.   Abdomen: Benign, Soft, non-tender. Renal:  No costovertebral tenderness  GU:  No performed at this time. Endoc: No evident thyromegaly, no signs of acromegaly. Skin:   warm, no rashes, no ecchymosis  Extremities: normal, no cyanosis, clubbing.  Other findings:    LABORATORY PANEL:   CBC No results for input(s): WBC, HGB, HCT, PLT in the last 168  hours. ------------------------------------------------------------------------------------------------------------------  Chemistries  No results for input(s): NA, K, CL, CO2, GLUCOSE, BUN, CREATININE, CALCIUM, MG, AST, ALT, ALKPHOS, BILITOT in the last 168 hours.  Invalid input(s): GFRCGP ------------------------------------------------------------------------------------------------------------------  Cardiac Enzymes No results for input(s): TROPONINI in the last 168 hours. ------------------------------------------------------------  RADIOLOGY:  No results found.     Thank  you for the consultation and for allowing [facility] to assist in the care of your patient. Our recommendations are noted above.  Please contact us if we can be of further service.   Marda Stalker, MD.  Board Certified in Internal Medicine, Pulmonary Medicine, [place], and Sleep Medicine.  [facility] Office Number: [phone]  Patricia Pesa, M.D.  Merton Border, M.D  04/10/2017

## 2017-04-11 ENCOUNTER — Ambulatory Visit (INDEPENDENT_AMBULATORY_CARE_PROVIDER_SITE_OTHER): Payer: Medicare Other | Admitting: Internal Medicine

## 2017-04-11 ENCOUNTER — Encounter: Payer: Self-pay | Admitting: Internal Medicine

## 2017-04-11 VITALS — BP 130/62 | HR 91 | Resp 16 | Ht 68.0 in | Wt 232.0 lb

## 2017-04-11 DIAGNOSIS — G4733 Obstructive sleep apnea (adult) (pediatric): Secondary | ICD-10-CM

## 2017-04-11 DIAGNOSIS — Z6834 Body mass index (BMI) 34.0-34.9, adult: Secondary | ICD-10-CM | POA: Diagnosis not present

## 2017-04-11 DIAGNOSIS — I10 Essential (primary) hypertension: Secondary | ICD-10-CM | POA: Diagnosis not present

## 2017-04-11 DIAGNOSIS — M5126 Other intervertebral disc displacement, lumbar region: Secondary | ICD-10-CM | POA: Diagnosis not present

## 2017-04-11 NOTE — Patient Instructions (Signed)
Continue using CPAP every night.  

## 2017-04-17 ENCOUNTER — Encounter: Payer: Self-pay | Admitting: Internal Medicine

## 2017-04-19 DIAGNOSIS — M5126 Other intervertebral disc displacement, lumbar region: Secondary | ICD-10-CM | POA: Diagnosis not present

## 2017-07-05 ENCOUNTER — Encounter: Payer: Self-pay | Admitting: Family Medicine

## 2017-07-05 ENCOUNTER — Ambulatory Visit (INDEPENDENT_AMBULATORY_CARE_PROVIDER_SITE_OTHER): Payer: Medicare Other | Admitting: Family Medicine

## 2017-07-05 VITALS — BP 130/72 | HR 65 | Temp 98.3°F | Wt 232.0 lb

## 2017-07-05 DIAGNOSIS — R05 Cough: Secondary | ICD-10-CM

## 2017-07-05 DIAGNOSIS — J302 Other seasonal allergic rhinitis: Secondary | ICD-10-CM

## 2017-07-05 DIAGNOSIS — R059 Cough, unspecified: Secondary | ICD-10-CM

## 2017-07-05 MED ORDER — BENZONATATE 100 MG PO CAPS: 100.0000 mg | ORAL_CAPSULE | Freq: Two times a day (BID) | ORAL | 0 refills | Status: DC | PRN

## 2017-07-05 MED ORDER — AZELASTINE HCL 0.1 % NA SOLN: 2.0000 | Freq: Two times a day (BID) | NASAL | 12 refills | Status: DC

## 2017-07-05 MED ORDER — LEVALBUTEROL TARTRATE 45 MCG/ACT IN AERO: 2.0000 | INHALATION_SPRAY | RESPIRATORY_TRACT | 1 refills | Status: DC | PRN

## 2017-07-05 MED ORDER — AZITHROMYCIN 250 MG PO TABS: ORAL_TABLET | ORAL | 0 refills | Status: DC
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
## 2017-07-05 NOTE — Patient Instructions (Signed)
Good to see you today  I think you have a viral upper respiratory infection on top of seasonal allergies  I have sent in Astelin nasal spray, tessalon perles and Xopenex inhaler.   Change your antihistamine and add Mucinex (generic is fine) to help thin secretions.   I have provided a printed prescription for an antibiotic if you are not feeling better in 3-5 days, have increased sputum or resumption of fevers.

## 2017-07-10 ENCOUNTER — Ambulatory Visit: Payer: TRICARE For Life (TFL) | Admitting: Internal Medicine

## 2017-08-01 DIAGNOSIS — Z6834 Body mass index (BMI) 34.0-34.9, adult: Secondary | ICD-10-CM | POA: Diagnosis not present

## 2017-08-01 DIAGNOSIS — I10 Essential (primary) hypertension: Secondary | ICD-10-CM | POA: Diagnosis not present

## 2017-08-01 DIAGNOSIS — M5126 Other intervertebral disc displacement, lumbar region: Secondary | ICD-10-CM | POA: Diagnosis not present

## 2017-08-29 ENCOUNTER — Ambulatory Visit (INDEPENDENT_AMBULATORY_CARE_PROVIDER_SITE_OTHER): Payer: Medicare Other | Admitting: Internal Medicine

## 2017-08-29 ENCOUNTER — Ambulatory Visit (INDEPENDENT_AMBULATORY_CARE_PROVIDER_SITE_OTHER)
Admission: RE | Admit: 2017-08-29 | Discharge: 2017-08-29 | Disposition: A | Discharge status: Home / Self Care | Payer: Medicare Other | Source: Ambulatory Visit | Attending: Internal Medicine | Admitting: Internal Medicine

## 2017-08-29 ENCOUNTER — Encounter: Payer: Self-pay | Admitting: Internal Medicine

## 2017-08-29 VITALS — BP 132/80 | HR 53 | Temp 98.2°F | Wt 233.0 lb

## 2017-08-29 DIAGNOSIS — R002 Palpitations: Secondary | ICD-10-CM | POA: Diagnosis not present

## 2017-08-29 DIAGNOSIS — R0789 Other chest pain: Secondary | ICD-10-CM

## 2017-08-29 DIAGNOSIS — I499 Cardiac arrhythmia, unspecified: Secondary | ICD-10-CM
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
## 2017-08-30 ENCOUNTER — Encounter: Payer: Self-pay | Admitting: Internal Medicine

## 2017-08-30 ENCOUNTER — Other Ambulatory Visit: Payer: Self-pay | Admitting: Internal Medicine

## 2017-08-30 DIAGNOSIS — R0602 Shortness of breath: Secondary | ICD-10-CM

## 2017-08-30 DIAGNOSIS — R002 Palpitations: Secondary | ICD-10-CM

## 2017-08-30 LAB — D-DIMER, QUANTITATIVE (NOT AT ARMC): D DIMER QUANT: 0.75 ug{FEU}/mL — AB (ref <0.50)

## 2017-09-01 ENCOUNTER — Encounter: Payer: Self-pay | Admitting: Internal Medicine

## 2017-09-01 NOTE — Patient Instructions (Signed)
Palpitations A palpitation is the feeling that your heart:  Has an uneven (irregular) heartbeat.  Is beating faster than normal.  Is fluttering.  Is skipping a beat.  This is usually not a serious problem. In some cases, you may need more medical tests. Follow these instructions at home:  Avoid: ? Caffeine in coffee, tea, soft drinks, diet pills, and energy drinks. ? Chocolate. ? Alcohol.  Do not use any tobacco products. These include cigarettes, chewing tobacco, and e-cigarettes. If you need help quitting, ask your doctor.  Try to reduce your stress. These things may help: ? Yoga. ? Meditation. ? Physical activity. Swimming, jogging, and walking are good choices. ? A method that helps you use your mind to control things in your body, like heartbeats (biofeedback).  Get plenty of rest and sleep.  Take over-the-counter and prescription medicines only as told by your doctor.  Keep all follow-up visits as told by your doctor. This is important. Contact a doctor if:  Your heartbeat is still fast or uneven after 24 hours.  Your palpitations occur more often. Get help right away if:  You have chest pain.  You feel short of breath.  You have a very bad headache.  You feel dizzy.  You pass out (faint). This information is not intended to replace advice given to you by your health care provider. Make sure you discuss any questions you have with your health care provider. Document Released: 12/15/2007 Document Revised: 08/13/2015 Document Reviewed: 11/20/2014 Elsevier Interactive Patient Education  2018 [employer]  

## 2017-09-14 DIAGNOSIS — M150 Primary generalized (osteo)arthritis: Secondary | ICD-10-CM | POA: Diagnosis not present

## 2017-09-14 DIAGNOSIS — L4050 Arthropathic psoriasis, unspecified: Secondary | ICD-10-CM | POA: Diagnosis not present

## 2017-09-25 DIAGNOSIS — R079 Chest pain, unspecified: Secondary | ICD-10-CM | POA: Insufficient documentation

## 2017-09-25 DIAGNOSIS — Z86718 Personal history of other venous thrombosis and embolism: Secondary | ICD-10-CM | POA: Insufficient documentation

## 2017-09-25 DIAGNOSIS — R002 Palpitations: Secondary | ICD-10-CM | POA: Insufficient documentation

## 2017-09-25 NOTE — Progress Notes (Signed)
Cardiology Office Note  Date:  09/27/2017   ID:  Samuel Crawford, DOB [birth year], MRN [id number]  PCP:  Jearld Fenton, NP   Chief Complaint  Patient presents with  . Other    Ref by Dr. Garnette Gunner for shortness of breath and palpitations. Meds reviewed by the pt. verbally. Pt. was diagnosed with sleep apnea in 2004 by a Cardiolgist in [place].     HPI:   [name] is a 64 short general with past medical history of OSA DVT from PICC line, then developed PE, x3 (sports injury calf, picc lines x 2) low back pain HTN Former smoker quit 1988 Who presents by referral from [facility] for palpitations tachycardia  3 weeks ago reported having 3 episodes of tachycardia up to 136 bpm Came on acutely without warning No precipitating causes Lasting several minutes and went away on Their own Noticed he was having this rhythm but no significant chest pain or shortness of breath Wanted to know if alcohol could trigger these rhythms Noted the heart rate was in the 130 range by pulse oximeter. This did not tell him if it was regular rhythm or irregular  Previous lyme disease He treated this on his own with loratadine for one year He had read a small study that indicated the over-the-counter medication might help his disease  No regular exercise program  EKG personally reviewed by myself on todays visit  shows NSR brady, T wave abn V5, V6, I and AVL Seen 08/2017 Not seen in 10/2016   PMH:   has a past medical history of Arthritis, back injury, Hypertension, Lyme disease, Pulmonary embolism ([employer]), Sleep apnea, and Wears hearing aid.  PSH:    Past Surgical History:  Procedure Laterality Date  . CATARACT EXTRACTION W/PHACO Right 12/07/2015   Procedure: CATARACT EXTRACTION PHACO AND INTRAOCULAR LENS PLACEMENT (IOC);  Surgeon: Ronnell Freshwater, MD;  Location: [facility];  Service: Ophthalmology;  Laterality: Right;  RIGHT sleep apnea  . CATARACT EXTRACTION W/PHACO Left  01/25/2016   Procedure: CATARACT EXTRACTION PHACO AND INTRAOCULAR LENS PLACEMENT (IOC);  Surgeon: Ronnell Freshwater, MD;  Location: [facility];  Service: Ophthalmology;  Laterality: Left;  LEFT sleep apnea  . HERNIA REPAIR Right inguinal   1988  . KNEE ARTHROSCOPY Bilateral    right knee 1992, left 2010    Current Outpatient Medications  Medication Sig Dispense Refill  . folic acid (FOLVITE) 1 MG tablet TAKE 2 TABLETS DAILY 180 tablet 0  . HUMIRA PEN 40 MG/0.8ML PNKT 40 mg every 30 (thirty) days.     . hydrochlorothiazide (HYDRODIURIL) 25 MG tablet Take 1 tablet (25 mg total) by mouth daily. 90 tablet 2  . loratadine (CLARITIN) 10 MG tablet Take 10 mg by mouth daily.    . Multiple Vitamin (MULTIVITAMIN) tablet Take 1 tablet by mouth daily.     No current facility-administered medications for this visit.      Allergies:   Shellfish allergy; Amoxicillin; Betadine [povidone iodine]; and Other   Social History:  The patient  reports that he quit smoking about 31 years ago. He has a 45.00 pack-year smoking history. He has never used smokeless tobacco. He reports that he drinks alcohol. He reports that he does not use drugs.   Family History:   family history includes Diabetes in his maternal grandmother and mother.    Review of Systems: Review of Systems  Constitutional: Negative.   Respiratory: Negative.   Cardiovascular: Positive for palpitations.  Tachycardia  Gastrointestinal: Negative.   Musculoskeletal: Negative.   Neurological: Negative.   Psychiatric/Behavioral: Negative.   All other systems reviewed and are negative.    PHYSICAL EXAM: VS:  BP (!) 142/74 (BP Location: Right Arm, Patient Position: Sitting, Cuff Size: Normal)   Pulse (!) 53   Ht 5\' 9"  (1.753 m)   Wt 230 lb 8 oz (104.6 kg)   BMI 34.04 kg/m  , BMI Body mass index is 34.04 kg/m. GEN: Well nourished, well developed, in no acute distress  HEENT: normal  Neck: no JVD, carotid bruits,  or masses Cardiac: RRR; no murmurs, rubs, or gallops,no edema  Respiratory:  clear to auscultation bilaterally, normal work of breathing GI: soft, nontender, nondistended, + BS MS: no deformity or atrophy  Skin: warm and dry, no rash Neuro:  Strength and sensation are intact Psych: euthymic mood, full affect   Recent Labs: 10/24/2016: ALT 23; BUN 16; Creat 0.91; Hemoglobin 16.6; Platelets 231; Potassium 4.0; Sodium 139    Lipid Panel Lab Results  Component Value Date   CHOL 175 10/24/2016   HDL 39 (L) 10/24/2016   LDLCALC 102 (H) 10/24/2016   TRIG 172 (H) 10/24/2016      Wt Readings from Last 3 Encounters:  09/27/17 230 lb 8 oz (104.6 kg)  08/29/17 233 lb (105.7 kg)  07/05/17 232 lb (105.2 kg)      ASSESSMENT AND PLAN:  Paroxysmal tachycardia Long discussion with him concerning arrhythmia 3 weeks ago Unable to exclude atrial fibrillation, atrial flutter, atrial tachycardia May have presented after drinking We did offer extended monitor for 2 weeks He has declined at this time given no further episodes Recommend he consider having monitor placed for any recurrent arrhythmia Discussed the need to identify atrial fibrillation if this is his arrhythmia given risk of stroke  Chest pain, unspecified type - Plan: EKG 12-Lead Denies having significant chest pain with exertion Abnormal EKG, details below  History of DVT (deep vein thrombosis) In the setting of PICC line 2 No recentrecurrence  History of pulmonary embolus (PE) Denies any recent DVT or PE Had previous workup with hematology  Abnormal EKG T-wave abnormality anterolateral leads V4 V5 1 and aVL Unable to exclude ischemia He does have a smoking history Denies anginal symptoms Recommended CT coronary calcium score for risk stratification. He will consider this  Disposition:   F/U as needed   Total encounter time more than 60 minutes  Greater than 50% was spent in counseling and coordination of care with  the patient    Orders Placed This Encounter  Procedures  . EKG 12-Lead     Signed, Esmond Plants, M.D., Ph.D. 09/27/2017  [place], [place]

## 2017-09-27 ENCOUNTER — Encounter: Payer: Self-pay | Admitting: Cardiovascular Disease

## 2017-09-27 ENCOUNTER — Ambulatory Visit (INDEPENDENT_AMBULATORY_CARE_PROVIDER_SITE_OTHER): Payer: Medicare Other | Admitting: Cardiovascular Disease

## 2017-09-27 VITALS — BP 142/74 | HR 53 | Ht 69.0 in | Wt 230.5 lb

## 2017-09-27 DIAGNOSIS — Z86718 Personal history of other venous thrombosis and embolism: Secondary | ICD-10-CM | POA: Diagnosis not present

## 2017-09-27 DIAGNOSIS — R079 Chest pain, unspecified: Secondary | ICD-10-CM

## 2017-09-27 DIAGNOSIS — R002 Palpitations: Secondary | ICD-10-CM | POA: Diagnosis not present

## 2017-09-27 DIAGNOSIS — I479 Paroxysmal tachycardia, unspecified: Secondary | ICD-10-CM

## 2017-09-27 DIAGNOSIS — Z86711 Personal history of pulmonary embolism: Secondary | ICD-10-CM

## 2017-09-27 NOTE — Patient Instructions (Signed)
Medication Instructions:   No medication changes made  Labwork:  No new labs needed  Testing/Procedures:  No further testing at this time  If you would like risk stratification for abnormal EKG, Call the office We could order a CT coronary calcium score   If you have more tachycardia, call the office We could order a Zio monitor , 2 weeks, event monitor  Try the smartphone apps,  Alive cor   Follow-Up: It was a pleasure seeing you in the office today. Please call us if you have new issues that need to be addressed before your next appt.  [phone]  Your physician wants you to follow-up in:  As needed  If you need a refill on your cardiac medications before your next appointment, please call your pharmacy.  For educational health videos Log in to : [email or web address] Or : [email or web address], password : [email or web address]

## 2017-10-05 DIAGNOSIS — D2261 Melanocytic nevi of right upper limb, including shoulder: Secondary | ICD-10-CM | POA: Diagnosis not present

## 2017-10-05 DIAGNOSIS — D225 Melanocytic nevi of trunk: Secondary | ICD-10-CM | POA: Diagnosis not present

## 2017-10-05 DIAGNOSIS — D2262 Melanocytic nevi of left upper limb, including shoulder: Secondary | ICD-10-CM | POA: Diagnosis not present

## 2017-10-05 DIAGNOSIS — L821 Other seborrheic keratosis: Secondary | ICD-10-CM | POA: Diagnosis not present

## 2017-10-05 DIAGNOSIS — L570 Actinic keratosis: Secondary | ICD-10-CM | POA: Diagnosis not present

## 2017-10-05 DIAGNOSIS — X32XXXA Exposure to sunlight, initial encounter: Secondary | ICD-10-CM | POA: Diagnosis not present

## 2017-10-21 ENCOUNTER — Encounter: Payer: Self-pay | Admitting: Cardiovascular Disease

## 2017-10-24 ENCOUNTER — Encounter: Payer: Self-pay | Admitting: Cardiovascular Disease

## 2017-12-04 ENCOUNTER — Ambulatory Visit: Payer: TRICARE For Life (TFL) | Admitting: Cardiovascular Disease

## 2018-01-10 DIAGNOSIS — Z23 Encounter for immunization: Secondary | ICD-10-CM | POA: Diagnosis not present

## 2018-01-12 DIAGNOSIS — H0015 Chalazion left lower eyelid: Secondary | ICD-10-CM | POA: Diagnosis not present

## 2018-01-15 ENCOUNTER — Other Ambulatory Visit: Payer: Self-pay | Admitting: Internal Medicine

## 2018-01-15 DIAGNOSIS — I10 Essential (primary) hypertension: Secondary | ICD-10-CM

## 2018-01-24 ENCOUNTER — Other Ambulatory Visit: Payer: Self-pay | Admitting: Internal Medicine

## 2018-01-24 DIAGNOSIS — I10 Essential (primary) hypertension: Secondary | ICD-10-CM

## 2018-01-24 DIAGNOSIS — A6920 Lyme disease, unspecified: Secondary | ICD-10-CM

## 2018-01-24 MED ORDER — HYDROCHLOROTHIAZIDE 25 MG PO TABS: 25.0000 mg | ORAL_TABLET | Freq: Every day | ORAL | 0 refills | Status: DC

## 2018-01-24 MED ORDER — FOLIC ACID 1 MG PO TABS: 2.0000 mg | ORAL_TABLET | Freq: Every day | ORAL | 0 refills | Status: DC

## 2018-01-30 ENCOUNTER — Encounter: Payer: Self-pay | Admitting: Internal Medicine

## 2018-01-30 ENCOUNTER — Ambulatory Visit (INDEPENDENT_AMBULATORY_CARE_PROVIDER_SITE_OTHER): Payer: Medicare Other | Admitting: Internal Medicine

## 2018-01-30 VITALS — BP 134/82 | HR 57 | Temp 98.1°F | Ht 68.5 in | Wt 227.0 lb

## 2018-01-30 DIAGNOSIS — L4050 Arthropathic psoriasis, unspecified: Secondary | ICD-10-CM | POA: Diagnosis not present

## 2018-01-30 DIAGNOSIS — G4733 Obstructive sleep apnea (adult) (pediatric): Secondary | ICD-10-CM | POA: Diagnosis not present

## 2018-01-30 DIAGNOSIS — Z0000 Encounter for general adult medical examination without abnormal findings: Secondary | ICD-10-CM | POA: Diagnosis not present

## 2018-01-30 DIAGNOSIS — Z125 Encounter for screening for malignant neoplasm of prostate: Secondary | ICD-10-CM | POA: Diagnosis not present

## 2018-01-30 DIAGNOSIS — E538 Deficiency of other specified B group vitamins: Secondary | ICD-10-CM

## 2018-01-30 DIAGNOSIS — I10 Essential (primary) hypertension: Secondary | ICD-10-CM

## 2018-01-30 DIAGNOSIS — Z9989 Dependence on other enabling machines and devices: Secondary | ICD-10-CM

## 2018-01-30 LAB — COMPREHENSIVE METABOLIC PANEL
ALK PHOS: 49 U/L (ref 39–117)
ALT: 17 U/L (ref 0–53)
AST: 19 U/L (ref 0–37)
Albumin: 4.4 g/dL (ref 3.5–5.2)
BILIRUBIN TOTAL: 0.6 mg/dL (ref 0.2–1.2)
BUN: 18 mg/dL (ref 6–23)
CO2: 34 mEq/L — ABNORMAL HIGH (ref 19–32)
Calcium: 9.8 mg/dL (ref 8.4–10.5)
Chloride: 97 mEq/L (ref 96–112)
Creatinine, Ser: 1.23 mg/dL (ref 0.40–1.50)
GFR: 62.45 mL/min (ref >60.00)
Glucose, Bld: 94 mg/dL (ref 70–99)
POTASSIUM: 4 meq/L (ref 3.5–5.1)
SODIUM: 140 meq/L (ref 135–145)
TOTAL PROTEIN: 6.9 g/dL (ref 6.0–8.3)

## 2018-01-30 LAB — LIPID PANEL
Cholesterol: 194 mg/dL (ref 0–200)
HDL: 40.5 mg/dL (ref >39.00)
LDL Cholesterol: 122 mg/dL — ABNORMAL HIGH (ref 0–99)
NONHDL: 153.49
Total CHOL/HDL Ratio: 5
Triglycerides: 156 mg/dL — ABNORMAL HIGH (ref 0.0–149.0)
VLDL: 31.2 mg/dL (ref 0.0–40.0)

## 2018-01-30 LAB — CBC
HCT: 44.9 % (ref 39.0–52.0)
Hemoglobin: 15.9 g/dL (ref 13.0–17.0)
MCHC: 35.3 g/dL (ref 30.0–36.0)
MCV: 92.5 fl (ref 78.0–100.0)
Platelets: 221 10*3/uL (ref 150.0–400.0)
RBC: 4.85 Mil/uL (ref 4.22–5.81)
RDW: 12.8 % (ref 11.5–15.5)
WBC: 7.5 10*3/uL (ref 4.0–10.5)

## 2018-01-30 LAB — PSA, MEDICARE: PSA: 1.03 ng/ml (ref 0.10–4.00)

## 2018-01-30 LAB — FOLATE

## 2018-01-30 MED ORDER — HYDROCHLOROTHIAZIDE 25 MG PO TABS: 25.0000 mg | ORAL_TABLET | Freq: Every day | ORAL | 3 refills | Status: DC
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
## 2018-01-30 NOTE — Patient Instructions (Signed)

## 2018-01-30 NOTE — Assessment & Plan Note (Signed)
Continue CPAP   Encouraged weight loss

## 2018-01-30 NOTE — Assessment & Plan Note (Signed)
Controlled on HCTZ Reinforced DASH diet and exercise for weight loss CBC and CMET today

## 2018-01-30 NOTE — Assessment & Plan Note (Signed)
Continue Humira and Folic Acid Folate level today Continue to follow with rheumatology

## 2018-02-01 ENCOUNTER — Encounter: Payer: Self-pay | Admitting: Internal Medicine

## 2018-04-09 ENCOUNTER — Encounter: Payer: Self-pay | Admitting: Internal Medicine

## 2018-04-09 ENCOUNTER — Ambulatory Visit (INDEPENDENT_AMBULATORY_CARE_PROVIDER_SITE_OTHER): Payer: Medicare Other | Admitting: Internal Medicine

## 2018-04-09 VITALS — BP 132/80 | HR 64 | Ht 68.0 in | Wt 229.0 lb

## 2018-04-09 DIAGNOSIS — G4733 Obstructive sleep apnea (adult) (pediatric): Secondary | ICD-10-CM

## 2018-04-09 NOTE — Patient Instructions (Signed)
Continue to use CPAP every night.  

## 2018-04-09 NOTE — Progress Notes (Signed)
[facility] Consultation      Assessment and Plan:  Obstructive sleep apnea. -Currently on auto CPAP with pressure range 5-20, appears to be well tolerated, with good control of symptoms. -Continue CPAP use every night.  Pulmonary embolism. -Patient developed a DVT from PICC line, then developed PE. - Is now off of anticoagulation.  Date: 04/09/2018  MRN# [id number] Samuel Crawford 1950-04-30    Samuel Crawford is a 68 y.o. old male seen in consultation for chief complaint of:    Chief Complaint  Patient presents with  . Follow-up    States his cpap has been working great. No new symptoms.     HPI:   The patient is 68 year old male, with a history of obstructive sleep apnea. He continues to do well CPAP, he is using it every night, he is more awake during the day.  He remains more awake during the day, he is not snoring. He is getting new supplies every 3 months and cleans them every week.   **Download dated 03/06/2018-04/04/2018>> raw data personally reviewed, usage greater than 4 hours is 30/30 days.  Average usage on days used is 7 hours 37 minutes, pressure range 5-20.  Leaks are slightly elevated but within normal limits.  Pressure median 7, 95th percentile pressure 10, maximum pressure 12.  Residual AHI is 2.6.  Overall this shows excellent control of obstructive sleep apnea with excellent compliance with CPAP. Download data review, 30 days as of 04/09/17.  Usage is 30/30 days.  Average usage on days used is 7 hours 30 minutes.  Setting is auto, 5-20.  Median pressure is 8.1, 95th percentile pressure is 11.6, maximum pressure is 13.  Residual AHI is 3.8.  Review of outside sleep studies; -Home sleep study 12/12/16; AHI of 24. -Baseline sleep study. 05/27/2005, AHI was 40, increased to 52 when supine. -CPAP titration 08/06/2005; CPAP titrated to a pressure level of 8. Overall these tests show severe affective sleep apnea, adequately treated, at a CPAP level of  8  Medication:    Current Outpatient Medications:  .  folic acid (FOLVITE) 1 MG tablet, Take 2 tablets (2 mg total) by mouth daily., Disp: 180 tablet, Rfl: 0 .  HUMIRA PEN 40 MG/0.8ML PNKT, 40 mg every 30 (thirty) days. , Disp: , Rfl:  .  hydrochlorothiazide (HYDRODIURIL) 25 MG tablet, Take 1 tablet (25 mg total) by mouth daily., Disp: 90 tablet, Rfl: 3 .  loratadine (CLARITIN) 10 MG tablet, Take 10 mg by mouth daily., Disp: , Rfl:  .  Multiple Vitamin (MULTIVITAMIN) tablet, Take 1 tablet by mouth daily., Disp: , Rfl:    Allergies:  Shellfish allergy; Amoxicillin; Betadine [povidone iodine]; and Other    Review of Systems:  Constitutional: Feels well. Cardiovascular: Denies chest pain, exertional chest pain.  Pulmonary: Denies hemoptysis, pleuritic chest pain.   The remainder of systems were reviewed and were found to be negative other than what is documented in the HPI.    Physical Examination:   VS: BP 132/80   Pulse 64   Ht 5\' 8"  (1.727 m)   Wt 229 lb (103.9 kg)   SpO2 97%   BMI 34.82 kg/m   General Appearance: No distress  Neuro:without focal findings, mental status, speech normal, alert and oriented HEENT: PERRLA, EOM intact Pulmonary: No wheezing, No rales  CardiovascularNormal S1,S2.  No m/r/g.  Abdomen: Benign, Soft, non-tender, No masses Renal:  No costovertebral tenderness  GU:  No performed at this time. Endoc: No evident thyromegaly,  no signs of acromegaly or Cushing features Skin:   warm, no rashes, no ecchymosis  Extremities: normal, no cyanosis, clubbing.    LABORATORY PANEL:   CBC No results for input(s): WBC, HGB, HCT, PLT in the last 168 hours. ------------------------------------------------------------------------------------------------------------------  Chemistries  No results for input(s): NA, K, CL, CO2, GLUCOSE, BUN, CREATININE, CALCIUM, MG, AST, ALT, ALKPHOS, BILITOT in the last 168 hours.  Invalid input(s):  GFRCGP ------------------------------------------------------------------------------------------------------------------  Cardiac Enzymes No results for input(s): TROPONINI in the last 168 hours. ------------------------------------------------------------  RADIOLOGY:  No results found.     Thank  you for the consultation and for allowing [facility] to assist in the care of your patient. Our recommendations are noted above.  Please contact us if we can be of further service.  Marda Stalker, M.D., F.C.C.P.  Board Certified in Internal Medicine, Pulmonary Medicine, [place], and Sleep Medicine.  [facility] Office Number: [phone]   04/09/2018

## 2018-04-10 ENCOUNTER — Ambulatory Visit: Payer: TRICARE For Life (TFL) | Admitting: Internal Medicine

## 2018-04-30 ENCOUNTER — Ambulatory Visit (INDEPENDENT_AMBULATORY_CARE_PROVIDER_SITE_OTHER): Payer: Medicare Other | Admitting: Internal Medicine

## 2018-04-30 ENCOUNTER — Encounter: Payer: Self-pay | Admitting: Internal Medicine

## 2018-04-30 VITALS — BP 130/80 | HR 60 | Temp 98.2°F | Ht 68.0 in | Wt 226.0 lb

## 2018-04-30 DIAGNOSIS — H9221 Otorrhagia, right ear: Secondary | ICD-10-CM | POA: Insufficient documentation

## 2018-04-30 MED ORDER — NEOMYCIN-COLIST-HC-THONZONIUM 3.3-3-10-0.5 MG/ML OT SUSP: 4.0000 [drp] | Freq: Four times a day (QID) | OTIC | 0 refills | Status: DC

## 2018-04-30 NOTE — Assessment & Plan Note (Addendum)
Looks like mild inflammation No truama but no clear reason---may have been from a q-tip No evidence of infection Will give Rx for steroid drops if worsens ENT if really worsens or persists
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
## 2018-05-01 ENCOUNTER — Ambulatory Visit: Payer: Medicare Other | Admitting: Internal Medicine

## 2018-07-26 ENCOUNTER — Encounter: Payer: Self-pay | Admitting: Internal Medicine

## 2018-07-26 DIAGNOSIS — K439 Ventral hernia without obstruction or gangrene: Secondary | ICD-10-CM

## 2018-08-08 ENCOUNTER — Other Ambulatory Visit: Payer: Self-pay | Admitting: General Surgery

## 2018-08-08 DIAGNOSIS — K439 Ventral hernia without obstruction or gangrene: Secondary | ICD-10-CM | POA: Diagnosis not present

## 2018-08-08 DIAGNOSIS — G935 Compression of brain: Secondary | ICD-10-CM

## 2018-08-08 DIAGNOSIS — M6208 Separation of muscle (nontraumatic), other site: Secondary | ICD-10-CM | POA: Diagnosis not present

## 2018-08-08 DIAGNOSIS — K469 Unspecified abdominal hernia without obstruction or gangrene: Secondary | ICD-10-CM

## 2018-08-09 ENCOUNTER — Other Ambulatory Visit: Payer: Self-pay | Admitting: General Surgery

## 2018-08-09 DIAGNOSIS — K439 Ventral hernia without obstruction or gangrene: Secondary | ICD-10-CM

## 2018-08-09 DIAGNOSIS — M6208 Separation of muscle (nontraumatic), other site: Secondary | ICD-10-CM

## 2018-08-16 ENCOUNTER — Ambulatory Visit
Admission: RE | Admit: 2018-08-16 | Discharge: 2018-08-16 | Disposition: A | Discharge status: Home / Self Care | Payer: Medicare Other | Source: Ambulatory Visit | Attending: General Surgery | Admitting: General Surgery

## 2018-08-16 ENCOUNTER — Other Ambulatory Visit: Payer: Self-pay

## 2018-08-16 DIAGNOSIS — K439 Ventral hernia without obstruction or gangrene: Secondary | ICD-10-CM | POA: Insufficient documentation

## 2018-08-16 DIAGNOSIS — R109 Unspecified abdominal pain: Secondary | ICD-10-CM | POA: Diagnosis not present

## 2018-08-16 DIAGNOSIS — M6208 Separation of muscle (nontraumatic), other site: Secondary | ICD-10-CM

## 2018-08-16 LAB — POCT I-STAT CREATININE: Creatinine, Ser: 1.1 mg/dL (ref 0.61–1.24)

## 2018-08-16 MED ORDER — IOHEXOL 300 MG/ML  SOLN
100.0000 mL | Freq: Once | INTRAMUSCULAR | Status: AC | PRN
  Administered 2018-08-16: 100 mL via INTRAVENOUS

## 2018-08-16 MED ORDER — IOPAMIDOL (ISOVUE-300) INJECTION 61%: 100.0000 mL | Freq: Once | INTRAVENOUS | Status: DC | PRN

## 2018-08-22 ENCOUNTER — Encounter: Payer: Self-pay | Admitting: Internal Medicine

## 2018-09-13 DIAGNOSIS — L4050 Arthropathic psoriasis, unspecified: Secondary | ICD-10-CM | POA: Diagnosis not present

## 2018-10-22 DIAGNOSIS — M1712 Unilateral primary osteoarthritis, left knee: Secondary | ICD-10-CM | POA: Diagnosis not present

## 2018-10-22 DIAGNOSIS — M7122 Synovial cyst of popliteal space [Baker], left knee: Secondary | ICD-10-CM | POA: Diagnosis not present

## 2018-10-22 DIAGNOSIS — M25462 Effusion, left knee: Secondary | ICD-10-CM | POA: Diagnosis not present

## 2018-10-22 DIAGNOSIS — M25562 Pain in left knee: Secondary | ICD-10-CM | POA: Diagnosis not present

## 2018-11-06 ENCOUNTER — Other Ambulatory Visit: Payer: Self-pay

## 2018-11-06 ENCOUNTER — Ambulatory Visit
Admission: RE | Admit: 2018-11-06 | Discharge: 2018-11-06 | Disposition: A | Discharge status: Home / Self Care | Payer: Medicare Other | Source: Ambulatory Visit | Attending: Sports Medicine | Admitting: Sports Medicine

## 2018-11-06 ENCOUNTER — Other Ambulatory Visit: Payer: Self-pay | Admitting: Sports Medicine

## 2018-11-06 DIAGNOSIS — M1712 Unilateral primary osteoarthritis, left knee: Secondary | ICD-10-CM | POA: Diagnosis not present

## 2018-11-06 DIAGNOSIS — M7122 Synovial cyst of popliteal space [Baker], left knee: Secondary | ICD-10-CM | POA: Diagnosis not present

## 2018-11-06 DIAGNOSIS — M25572 Pain in left ankle and joints of left foot: Secondary | ICD-10-CM | POA: Diagnosis not present

## 2018-11-06 DIAGNOSIS — M7989 Other specified soft tissue disorders: Secondary | ICD-10-CM

## 2018-11-06 DIAGNOSIS — M25562 Pain in left knee: Secondary | ICD-10-CM | POA: Diagnosis not present

## 2018-11-06 DIAGNOSIS — M25462 Effusion, left knee: Secondary | ICD-10-CM | POA: Diagnosis not present

## 2018-11-06 DIAGNOSIS — G8929 Other chronic pain: Secondary | ICD-10-CM | POA: Diagnosis not present

## 2018-11-08 ENCOUNTER — Other Ambulatory Visit: Payer: Self-pay | Admitting: Sports Medicine

## 2018-11-08 DIAGNOSIS — M25462 Effusion, left knee: Secondary | ICD-10-CM

## 2018-11-08 DIAGNOSIS — M7122 Synovial cyst of popliteal space [Baker], left knee: Secondary | ICD-10-CM

## 2018-11-08 DIAGNOSIS — G8929 Other chronic pain: Secondary | ICD-10-CM

## 2018-11-08 DIAGNOSIS — M1712 Unilateral primary osteoarthritis, left knee: Secondary | ICD-10-CM

## 2018-11-08 DIAGNOSIS — M25562 Pain in left knee: Secondary | ICD-10-CM

## 2018-11-16 ENCOUNTER — Ambulatory Visit
Admission: RE | Admit: 2018-11-16 | Discharge: 2018-11-16 | Disposition: A | Discharge status: Home / Self Care | Payer: Medicare Other | Source: Ambulatory Visit | Attending: Sports Medicine | Admitting: Sports Medicine

## 2018-11-16 ENCOUNTER — Other Ambulatory Visit: Payer: Self-pay

## 2018-11-16 DIAGNOSIS — L821 Other seborrheic keratosis: Secondary | ICD-10-CM | POA: Diagnosis not present

## 2018-11-16 DIAGNOSIS — X32XXXA Exposure to sunlight, initial encounter: Secondary | ICD-10-CM | POA: Diagnosis not present

## 2018-11-16 DIAGNOSIS — M7122 Synovial cyst of popliteal space [Baker], left knee: Secondary | ICD-10-CM | POA: Diagnosis not present

## 2018-11-16 DIAGNOSIS — G8929 Other chronic pain: Secondary | ICD-10-CM

## 2018-11-16 DIAGNOSIS — D2261 Melanocytic nevi of right upper limb, including shoulder: Secondary | ICD-10-CM | POA: Diagnosis not present

## 2018-11-16 DIAGNOSIS — M25562 Pain in left knee: Secondary | ICD-10-CM | POA: Diagnosis not present

## 2018-11-16 DIAGNOSIS — D2262 Melanocytic nevi of left upper limb, including shoulder: Secondary | ICD-10-CM | POA: Diagnosis not present

## 2018-11-16 DIAGNOSIS — L820 Inflamed seborrheic keratosis: Secondary | ICD-10-CM | POA: Diagnosis not present

## 2018-11-16 DIAGNOSIS — M25462 Effusion, left knee: Secondary | ICD-10-CM

## 2018-11-16 DIAGNOSIS — D2272 Melanocytic nevi of left lower limb, including hip: Secondary | ICD-10-CM | POA: Diagnosis not present

## 2018-11-16 DIAGNOSIS — L538 Other specified erythematous conditions: Secondary | ICD-10-CM | POA: Diagnosis not present

## 2018-11-16 DIAGNOSIS — M25862 Other specified joint disorders, left knee: Secondary | ICD-10-CM | POA: Diagnosis not present

## 2018-11-16 DIAGNOSIS — M1712 Unilateral primary osteoarthritis, left knee: Secondary | ICD-10-CM | POA: Diagnosis not present

## 2018-11-16 DIAGNOSIS — D2271 Melanocytic nevi of right lower limb, including hip: Secondary | ICD-10-CM | POA: Diagnosis not present

## 2018-11-16 DIAGNOSIS — L400 Psoriasis vulgaris: Secondary | ICD-10-CM | POA: Diagnosis not present

## 2018-11-16 DIAGNOSIS — D225 Melanocytic nevi of trunk: Secondary | ICD-10-CM | POA: Diagnosis not present

## 2018-11-16 DIAGNOSIS — L570 Actinic keratosis: Secondary | ICD-10-CM | POA: Diagnosis not present

## 2018-11-21 DIAGNOSIS — S83242A Other tear of medial meniscus, current injury, left knee, initial encounter: Secondary | ICD-10-CM | POA: Diagnosis not present

## 2018-11-21 DIAGNOSIS — M2342 Loose body in knee, left knee: Secondary | ICD-10-CM | POA: Diagnosis not present

## 2018-11-21 DIAGNOSIS — M659 Synovitis and tenosynovitis, unspecified: Secondary | ICD-10-CM | POA: Diagnosis not present

## 2018-12-20 DIAGNOSIS — Z1159 Encounter for screening for other viral diseases: Secondary | ICD-10-CM | POA: Diagnosis not present

## 2018-12-25 ENCOUNTER — Other Ambulatory Visit: Payer: Self-pay

## 2018-12-25 ENCOUNTER — Encounter
Admission: RE | Admit: 2018-12-25 | Discharge: 2018-12-25 | Disposition: A | Discharge status: Home / Self Care | Payer: Medicare Other | Source: Ambulatory Visit | Attending: Orthopedic Surgery | Admitting: Orthopedic Surgery

## 2018-12-25 DIAGNOSIS — Z01818 Encounter for other preprocedural examination: Secondary | ICD-10-CM | POA: Diagnosis not present

## 2018-12-25 DIAGNOSIS — R001 Bradycardia, unspecified: Secondary | ICD-10-CM | POA: Insufficient documentation

## 2018-12-25 DIAGNOSIS — R9431 Abnormal electrocardiogram [ECG] [EKG]: Secondary | ICD-10-CM | POA: Insufficient documentation

## 2018-12-25 DIAGNOSIS — M2342 Loose body in knee, left knee: Secondary | ICD-10-CM | POA: Insufficient documentation

## 2018-12-25 DIAGNOSIS — Z01810 Encounter for preprocedural cardiovascular examination: Secondary | ICD-10-CM | POA: Diagnosis not present

## 2018-12-25 DIAGNOSIS — I10 Essential (primary) hypertension: Secondary | ICD-10-CM | POA: Diagnosis not present

## 2018-12-25 HISTORY — DX: Unspecified asthma, uncomplicated: J45.909

## 2018-12-25 LAB — BASIC METABOLIC PANEL
Anion gap: 10 (ref 5–15)
BUN: 20 mg/dL (ref 8–23)
CO2: 31 mmol/L (ref 22–32)
Calcium: 9.6 mg/dL (ref 8.9–10.3)
Chloride: 98 mmol/L (ref 98–111)
Creatinine, Ser: 1.02 mg/dL (ref 0.61–1.24)
GFR calc Af Amer: >60 mL/min (ref >60)
GFR calc non Af Amer: >60 mL/min (ref >60)
Glucose, Bld: 120 mg/dL — ABNORMAL HIGH (ref 70–99)
Potassium: 3.5 mmol/L (ref 3.5–5.1)
Sodium: 139 mmol/L (ref 135–145)

## 2018-12-25 LAB — CBC
HCT: 42 % (ref 39.0–52.0)
Hemoglobin: 14.5 g/dL (ref 13.0–17.0)
MCH: 31.1 pg (ref 26.0–34.0)
MCHC: 34.5 g/dL (ref 30.0–36.0)
MCV: 90.1 fL (ref 80.0–100.0)
Platelets: 272 10*3/uL (ref 150–400)
RBC: 4.66 MIL/uL (ref 4.22–5.81)
RDW: 12 % (ref 11.5–15.5)
WBC: 9.8 10*3/uL (ref 4.0–10.5)
nRBC: 0 % (ref 0.0–0.2)

## 2018-12-25 NOTE — Pre-Procedure Instructions (Signed)
Samuel Merritts, MD  Physician  Cardiology  Progress Notes  Signed  Encounter Date:  09/27/2017          Signed      Expand All Collapse All    Show:Clear all [x] Manual[x] Template[x] Copied  Added by: [x] Samuel Merritts, MD  [] Hover for details Cardiology Office Note  Date:  09/27/2017   ID:  Samuel Crawford, DOB 1950/09/28, MRN [id number]  PCP:  Samuel Fenton, NP          Chief Complaint  Patient presents with  . Other    Ref by Dr. Garnette Crawford for shortness of breath and palpitations. Meds reviewed by the pt. verbally. Pt. was diagnosed with sleep apnea in 2004 by a Cardiolgist in [place].     HPI:   [name] is a 27 short general with past medical history of OSA DVT from PICC line, then developed PE, x3 (sports injury calf, picc lines x 2) low back pain HTN Former smoker quit 1988 Who presents by referral from Samuel Crawford for palpitations tachycardia  3 weeks ago reported having 3 episodes of tachycardia up to 136 bpm Came on acutely without warning No precipitating causes Lasting several minutes and went away on Their own Noticed he was having this rhythm but no significant chest pain or shortness of breath Wanted to know if alcohol could trigger these rhythms Noted the heart rate was in the 130 range by pulse oximeter. This did not tell him if it was regular rhythm or irregular  Previous lyme disease He treated this on his own with loratadine for one year He had read a small study that indicated the over-the-counter medication might help his disease  No regular exercise program  EKG personally reviewed by myself on todays visit  shows NSR brady, T wave abn V5, V6, I and AVL Seen 08/2017 Not seen in 10/2016   PMH:   has a past medical history of Arthritis, back injury, Hypertension, Lyme disease, Pulmonary embolism ([place]), Sleep apnea, and Wears hearing aid.  PSH:         Past Surgical History:  Procedure Laterality Date  .  CATARACT EXTRACTION W/PHACO Right 12/07/2015   Procedure: CATARACT EXTRACTION PHACO AND INTRAOCULAR LENS PLACEMENT (IOC);  Surgeon: Samuel Freshwater, MD;  Location: [facility];  Service: Ophthalmology;  Laterality: Right;  RIGHT sleep apnea  . CATARACT EXTRACTION W/PHACO Left 01/25/2016   Procedure: CATARACT EXTRACTION PHACO AND INTRAOCULAR LENS PLACEMENT (IOC);  Surgeon: Samuel Freshwater, MD;  Location: [facility];  Service: Ophthalmology;  Laterality: Left;  LEFT sleep apnea  . HERNIA REPAIR Right inguinal   1988  . KNEE ARTHROSCOPY Bilateral    right knee 1992, left 2010          Current Outpatient Medications  Medication Sig Dispense Refill  . folic acid (FOLVITE) 1 MG tablet TAKE 2 TABLETS DAILY 180 tablet 0  . HUMIRA PEN 40 MG/0.8ML PNKT 40 mg every 30 (thirty) days.     . hydrochlorothiazide (HYDRODIURIL) 25 MG tablet Take 1 tablet (25 mg total) by mouth daily. 90 tablet 2  . loratadine (CLARITIN) 10 MG tablet Take 10 mg by mouth daily.    . Multiple Vitamin (MULTIVITAMIN) tablet Take 1 tablet by mouth daily.     No current facility-administered medications for this visit.      Allergies:   Shellfish allergy; Amoxicillin; Betadine [povidone iodine]; and Other   Social History:  The patient  reports that he quit  smoking about 31 years ago. He has a 45.00 pack-year smoking history. He has never used smokeless tobacco. He reports that he drinks alcohol. He reports that he does not use drugs.   Family History:   family history includes Diabetes in his maternal grandmother and mother.    Review of Systems: Review of Systems  Constitutional: Negative.   Respiratory: Negative.   Cardiovascular: Positive for palpitations.       Tachycardia  Gastrointestinal: Negative.   Musculoskeletal: Negative.   Neurological: Negative.   Psychiatric/Behavioral: Negative.   All other systems reviewed and are negative.    PHYSICAL  EXAM: VS:  BP (!) 142/74 (BP Location: Right Arm, Patient Position: Sitting, Cuff Size: Normal)   Pulse (!) 53   Ht 5\' 9"  (1.753 m)   Wt 230 lb 8 oz (104.6 kg)   BMI 34.04 kg/m  , BMI Body mass index is 34.04 kg/m. GEN: Well nourished, well developed, in no acute distress  HEENT: normal  Neck: no JVD, carotid bruits, or masses Cardiac: RRR; no murmurs, rubs, or gallops,no edema  Respiratory:  clear to auscultation bilaterally, normal work of breathing GI: soft, nontender, nondistended, + BS MS: no deformity or atrophy  Skin: warm and dry, no rash Neuro:  Strength and sensation are intact Psych: euthymic mood, full affect   Recent Labs: 10/24/2016: ALT 23; BUN 16; Creat 0.91; Hemoglobin 16.6; Platelets 231; Potassium 4.0; Sodium 139    Lipid Panel Recent Labs       Lab Results  Component Value Date   CHOL 175 10/24/2016   HDL 39 (L) 10/24/2016   LDLCALC 102 (H) 10/24/2016   TRIG 172 (H) 10/24/2016           Wt Readings from Last 3 Encounters:  09/27/17 230 lb 8 oz (104.6 kg)  08/29/17 233 lb (105.7 kg)  07/05/17 232 lb (105.2 kg)      ASSESSMENT AND PLAN:  Paroxysmal tachycardia Long discussion with him concerning arrhythmia 3 weeks ago Unable to exclude atrial fibrillation, atrial flutter, atrial tachycardia May have presented after drinking We did offer extended monitor for 2 weeks He has declined at this time given no further episodes Recommend he consider having monitor placed for any recurrent arrhythmia Discussed the need to identify atrial fibrillation if this is his arrhythmia given risk of stroke  Chest pain, unspecified type - Plan: EKG 12-Lead Denies having significant chest pain with exertion Abnormal EKG, details below  History of DVT (deep vein thrombosis) In the setting of PICC line 2 No recentrecurrence  History of pulmonary embolus (PE) Denies any recent DVT or PE Had previous workup with hematology  Abnormal EKG  T-wave abnormality anterolateral leads V4 V5 1 and aVL Unable to exclude ischemia He does have a smoking history Denies anginal symptoms Recommended CT coronary calcium score for risk stratification. He will consider this  Disposition:   F/U as needed   Total encounter time more than 60 minutes  Greater than 50% was spent in counseling and coordination of care with the patient       Orders Placed This Encounter  Procedures  . EKG 12-Lead     Signed, Esmond Plants, M.D., Ph.D. 09/27/2017  [place]         Electronically signed by Samuel Merritts, MD at 09/27/2017 5:46 PM   Office Visit on 09/27/2017     Detailed Report

## 2018-12-25 NOTE — Pre-Procedure Instructions (Signed)
Messaged dr Rosey Bath regarding EKG that was done today. Same as the one from 2019. Pt having no cardiac symptoms. Ok to proceed per Dr Rosey Bath

## 2018-12-25 NOTE — Patient Instructions (Signed)
Your procedure is scheduled on: 12-31-18 MONDAY Report to Same Day Surgery 2nd floor medical mall [facility] Entrance-take elevator on left to 2nd floor.  Check in with surgery information desk.) To find out your arrival time please call [phone] between 1PM - 3PM on 12-28-18 FRIDAY  Remember: Instructions that are not followed completely may result in serious medical risk, up to and including death, or upon the discretion of your surgeon and anesthesiologist your surgery may need to be rescheduled.    _x___ 1. Do not eat food after midnight the night before your procedure. NO GUM OR CANDY AFTER MIDNIGHT. You may drink clear liquids up to 2 hours before you are scheduled to arrive at the hospital for your procedure.  Do not drink clear liquids within 2 hours of your scheduled arrival to the hospital.  Clear liquids include  --Water or Apple juice without pulp  --Gatorade  --Black Coffee or Clear Tea (No milk, no creamers, do not add anything to the coffee or Tea)   ____Ensure clear carbohydrate drink on the way to the hospital for bariatric patients  ____Ensure clear carbohydrate drink 3 hours before surgery.    __x__ 2. No Alcohol for 24 hours before or after surgery.   __x__3. No Smoking or e-cigarettes for 24 prior to surgery.  Do not use any chewable tobacco products for at least 6 hour prior to surgery   ____  4. Bring all medications with you on the day of surgery if instructed.    __x__ 5. Notify your doctor if there is any change in your medical condition     (cold, fever, infections).    x___6. On the morning of surgery brush your teeth with toothpaste and water.  You may rinse your mouth with mouth wash if you wish.  Do not swallow any toothpaste or mouthwash.   Do not wear jewelry, make-up, hairpins, clips or nail polish.  Do not wear lotions, powders, or perfumes.   Do not shave 48 hours prior to surgery. Men may shave face and neck.  Do not bring valuables to  the hospital.    [facility] is not responsible for any belongings or valuables.               Contacts, dentures or bridgework may not be worn into surgery.  Leave your suitcase in the car. After surgery it may be brought to your room.  For patients admitted to the hospital, discharge time is determined by your treatment team.  _  Patients discharged the day of surgery will not be allowed to drive home.  You will need someone to drive you home and stay with you the night of your procedure.    Please read over the following fact sheets that you were given:   [facility] Preparing for Surgery  ____ Take anti-hypertensive listed below, cardiac, seizure, asthma, anti-reflux and psychiatric medicines. These include:  1. NONE  2.  3.  4.  5.  6.  ____Fleets enema or Magnesium Citrate as directed.   _x___ Use CHG Soap or sage wipes as directed on instruction sheet   ____ Use inhalers on the day of surgery and bring to hospital day of surgery  ____ Stop Metformin and Janumet 2 days prior to surgery.    ____ Take 1/2 of usual insulin dose the night before surgery and none on the morning surgery.   ____ Follow recommendations from Cardiologist, Pulmonologist or PCP regarding stopping Aspirin, Coumadin, Plavix ,Eliquis,  Effient, or Pradaxa, and Pletal.  X____Stop Anti-inflammatories such as Advil, Aleve, Ibuprofen, Motrin, Naproxen, Naprosyn, Goodies powders or aspirin products NOW-OK to take Tylenol and Celebrex    _x___ Stop supplements until after surgery-STOP [place]   _X___ Bring C-Pap to the hospital.

## 2018-12-27 ENCOUNTER — Other Ambulatory Visit
Admission: RE | Admit: 2018-12-27 | Discharge: 2018-12-27 | Disposition: A | Discharge status: Home / Self Care | Payer: Medicare Other | Source: Ambulatory Visit | Attending: Orthopedic Surgery | Admitting: Orthopedic Surgery

## 2018-12-27 ENCOUNTER — Other Ambulatory Visit: Payer: Self-pay

## 2018-12-27 DIAGNOSIS — Z20828 Contact with and (suspected) exposure to other viral communicable diseases: Secondary | ICD-10-CM | POA: Diagnosis not present

## 2018-12-27 DIAGNOSIS — Z01812 Encounter for preprocedural laboratory examination: Secondary | ICD-10-CM | POA: Diagnosis not present

## 2018-12-27 LAB — SARS CORONAVIRUS 2 (TAT 6-24 HRS): SARS Coronavirus 2: NEGATIVE

## 2018-12-28 NOTE — Pre-Procedure Instructions (Signed)
Pt allergic to amoxicillin- ancef ordered preop-called Casey at dr patels office-she said to change it to clindamycin 900 mg

## 2018-12-31 ENCOUNTER — Encounter
Admission: RE | Disposition: A | Discharge status: Home / Self Care | Payer: Self-pay | Source: Home / Self Care | Attending: Orthopedic Surgery

## 2018-12-31 ENCOUNTER — Ambulatory Visit: Payer: Medicare Other | Admitting: Anesthesiology

## 2018-12-31 ENCOUNTER — Ambulatory Visit
Admission: RE | Admit: 2018-12-31 | Discharge: 2018-12-31 | Disposition: A | Discharge status: Home / Self Care | Payer: Medicare Other | Attending: Orthopedic Surgery | Admitting: Orthopedic Surgery

## 2018-12-31 ENCOUNTER — Other Ambulatory Visit: Payer: Self-pay

## 2018-12-31 DIAGNOSIS — Z880 Allergy status to penicillin: Secondary | ICD-10-CM | POA: Diagnosis not present

## 2018-12-31 DIAGNOSIS — S83242A Other tear of medial meniscus, current injury, left knee, initial encounter: Secondary | ICD-10-CM | POA: Insufficient documentation

## 2018-12-31 DIAGNOSIS — M2342 Loose body in knee, left knee: Secondary | ICD-10-CM | POA: Insufficient documentation

## 2018-12-31 DIAGNOSIS — G4730 Sleep apnea, unspecified: Secondary | ICD-10-CM | POA: Insufficient documentation

## 2018-12-31 DIAGNOSIS — M65162 Other infective (teno)synovitis, left knee: Secondary | ICD-10-CM | POA: Diagnosis not present

## 2018-12-31 DIAGNOSIS — M069 Rheumatoid arthritis, unspecified: Secondary | ICD-10-CM | POA: Insufficient documentation

## 2018-12-31 DIAGNOSIS — L4050 Arthropathic psoriasis, unspecified: Secondary | ICD-10-CM | POA: Diagnosis not present

## 2018-12-31 DIAGNOSIS — X58XXXA Exposure to other specified factors, initial encounter: Secondary | ICD-10-CM | POA: Insufficient documentation

## 2018-12-31 DIAGNOSIS — M1712 Unilateral primary osteoarthritis, left knee: Secondary | ICD-10-CM | POA: Diagnosis not present

## 2018-12-31 DIAGNOSIS — Z79899 Other long term (current) drug therapy: Secondary | ICD-10-CM | POA: Diagnosis not present

## 2018-12-31 DIAGNOSIS — Z87891 Personal history of nicotine dependence: Secondary | ICD-10-CM | POA: Insufficient documentation

## 2018-12-31 DIAGNOSIS — J45909 Unspecified asthma, uncomplicated: Secondary | ICD-10-CM | POA: Diagnosis not present

## 2018-12-31 DIAGNOSIS — I10 Essential (primary) hypertension: Secondary | ICD-10-CM | POA: Insufficient documentation

## 2018-12-31 DIAGNOSIS — Z791 Long term (current) use of non-steroidal anti-inflammatories (NSAID): Secondary | ICD-10-CM | POA: Diagnosis not present

## 2018-12-31 DIAGNOSIS — M659 Synovitis and tenosynovitis, unspecified: Secondary | ICD-10-CM | POA: Insufficient documentation

## 2018-12-31 DIAGNOSIS — M06862 Other specified rheumatoid arthritis, left knee: Secondary | ICD-10-CM | POA: Diagnosis not present

## 2018-12-31 DIAGNOSIS — Z888 Allergy status to other drugs, medicaments and biological substances status: Secondary | ICD-10-CM | POA: Diagnosis not present

## 2018-12-31 HISTORY — PX: KNEE ARTHROSCOPY WITH MEDIAL MENISECTOMY: SHX5651

## 2018-12-31 SURGERY — ARTHROSCOPY, KNEE, WITH MEDIAL MENISCECTOMY
Anesthesia: General | Site: Knee | Laterality: Left

## 2018-12-31 MED ORDER — LIDOCAINE HCL (CARDIAC) PF 100 MG/5ML IV SOSY
PREFILLED_SYRINGE | INTRAVENOUS | Status: DC | PRN
  Administered 2018-12-31: 60 mg via INTRAVENOUS

## 2018-12-31 MED ORDER — FENTANYL CITRATE (PF) 100 MCG/2ML IJ SOLN
INTRAMUSCULAR | Status: AC
  Filled 2018-12-31: qty 2

## 2018-12-31 MED ORDER — LIDOCAINE-EPINEPHRINE 1 %-1:100000 IJ SOLN
INTRAMUSCULAR | Status: AC
  Filled 2018-12-31: qty 1

## 2018-12-31 MED ORDER — ACETAMINOPHEN 10 MG/ML IV SOLN
INTRAVENOUS | Status: DC | PRN
  Administered 2018-12-31: 1000 mg via INTRAVENOUS

## 2018-12-31 MED ORDER — FAMOTIDINE 20 MG PO TABS
ORAL_TABLET | ORAL | Status: AC
  Administered 2018-12-31: 20 mg via ORAL
  Filled 2018-12-31: qty 1

## 2018-12-31 MED ORDER — EPHEDRINE SULFATE 50 MG/ML IJ SOLN
INTRAMUSCULAR | Status: DC | PRN
  Administered 2018-12-31: 10 mg via INTRAVENOUS
  Administered 2018-12-31 (×2): 5 mg via INTRAVENOUS

## 2018-12-31 MED ORDER — PROMETHAZINE HCL 25 MG/ML IJ SOLN: 6.2500 mg | INTRAMUSCULAR | Status: DC | PRN

## 2018-12-31 MED ORDER — DEXAMETHASONE SODIUM PHOSPHATE 10 MG/ML IJ SOLN
INTRAMUSCULAR | Status: DC | PRN
  Administered 2018-12-31: 10 mg via INTRAVENOUS

## 2018-12-31 MED ORDER — FENTANYL CITRATE (PF) 100 MCG/2ML IJ SOLN
INTRAMUSCULAR | Status: DC | PRN
  Administered 2018-12-31 (×5): 25 ug via INTRAVENOUS

## 2018-12-31 MED ORDER — ONDANSETRON 4 MG PO TBDP: 4.0000 mg | ORAL_TABLET | Freq: Three times a day (TID) | ORAL | 0 refills | Status: DC | PRN

## 2018-12-31 MED ORDER — LACTATED RINGERS IV SOLN
INTRAVENOUS | Status: DC
  Administered 2018-12-31: 50 mL/h via INTRAVENOUS

## 2018-12-31 MED ORDER — EPHEDRINE SULFATE 50 MG/ML IJ SOLN
INTRAMUSCULAR | Status: AC
  Filled 2018-12-31: qty 1

## 2018-12-31 MED ORDER — LIDOCAINE-EPINEPHRINE 1 %-1:100000 IJ SOLN
INTRAMUSCULAR | Status: DC | PRN
  Administered 2018-12-31: 7.5 mL

## 2018-12-31 MED ORDER — PROPOFOL 10 MG/ML IV BOLUS
INTRAVENOUS | Status: DC | PRN
  Administered 2018-12-31: 200 mg via INTRAVENOUS

## 2018-12-31 MED ORDER — CLINDAMYCIN PHOSPHATE 900 MG/50ML IV SOLN
900.0000 mg | Freq: Once | INTRAVENOUS | Status: AC
  Administered 2018-12-31: 900 mg via INTRAVENOUS

## 2018-12-31 MED ORDER — KETOROLAC TROMETHAMINE 30 MG/ML IJ SOLN
INTRAMUSCULAR | Status: AC
  Filled 2018-12-31: qty 1

## 2018-12-31 MED ORDER — ONDANSETRON HCL 4 MG/2ML IJ SOLN
INTRAMUSCULAR | Status: DC | PRN
  Administered 2018-12-31: 4 mg via INTRAVENOUS

## 2018-12-31 MED ORDER — ASPIRIN EC 325 MG PO TBEC: 325.0000 mg | DELAYED_RELEASE_TABLET | Freq: Every day | ORAL | 0 refills | Status: AC

## 2018-12-31 MED ORDER — CLINDAMYCIN PHOSPHATE 900 MG/50ML IV SOLN
INTRAVENOUS | Status: AC
  Filled 2018-12-31: qty 50

## 2018-12-31 MED ORDER — PROPOFOL 10 MG/ML IV BOLUS
INTRAVENOUS | Status: AC
  Filled 2018-12-31: qty 20

## 2018-12-31 MED ORDER — ACETAMINOPHEN 10 MG/ML IV SOLN
INTRAVENOUS | Status: AC
  Filled 2018-12-31: qty 100

## 2018-12-31 MED ORDER — LACTATED RINGERS IV SOLN
INTRAVENOUS | Status: DC | PRN
  Administered 2018-12-31: 4 mL

## 2018-12-31 MED ORDER — LIDOCAINE HCL (PF) 2 % IJ SOLN
INTRAMUSCULAR | Status: AC
  Filled 2018-12-31: qty 10

## 2018-12-31 MED ORDER — BUPIVACAINE HCL (PF) 0.5 % IJ SOLN
INTRAMUSCULAR | Status: AC
  Filled 2018-12-31: qty 30

## 2018-12-31 MED ORDER — DEXAMETHASONE SODIUM PHOSPHATE 10 MG/ML IJ SOLN
INTRAMUSCULAR | Status: AC
  Filled 2018-12-31: qty 1

## 2018-12-31 MED ORDER — ACETAMINOPHEN 500 MG PO TABS: 1000.0000 mg | ORAL_TABLET | Freq: Three times a day (TID) | ORAL | 2 refills | Status: DC

## 2018-12-31 MED ORDER — MIDAZOLAM HCL 2 MG/2ML IJ SOLN
INTRAMUSCULAR | Status: DC | PRN
  Administered 2018-12-31: 2 mg via INTRAVENOUS

## 2018-12-31 MED ORDER — FAMOTIDINE 20 MG PO TABS
20.0000 mg | ORAL_TABLET | Freq: Once | ORAL | Status: AC
  Administered 2018-12-31: 07:00:00 20 mg via ORAL

## 2018-12-31 MED ORDER — FENTANYL CITRATE (PF) 100 MCG/2ML IJ SOLN: 25.0000 ug | INTRAMUSCULAR | Status: DC | PRN

## 2018-12-31 MED ORDER — ONDANSETRON HCL 4 MG/2ML IJ SOLN
INTRAMUSCULAR | Status: AC
  Filled 2018-12-31: qty 2

## 2018-12-31 MED ORDER — MIDAZOLAM HCL 2 MG/2ML IJ SOLN
INTRAMUSCULAR | Status: AC
  Filled 2018-12-31: qty 2

## 2018-12-31 MED ORDER — KETOROLAC TROMETHAMINE 30 MG/ML IJ SOLN
INTRAMUSCULAR | Status: DC | PRN
  Administered 2018-12-31: 30 mg via INTRAVENOUS

## 2018-12-31 MED ORDER — BUPIVACAINE HCL (PF) 0.5 % IJ SOLN
INTRAMUSCULAR | Status: DC | PRN
  Administered 2018-12-31: 7.5 mL

## 2018-12-31 MED ORDER — HYDROCODONE-ACETAMINOPHEN 5-325 MG PO TABS: 1.0000 | ORAL_TABLET | ORAL | 0 refills | Status: DC | PRN

## 2018-12-31 SURGICAL SUPPLY — 55 items
ADAPTER IRRIG TUBE 2 SPIKE SOL (ADAPTER) ×4 IMPLANT
BLADE SURG SZ11 CARB STEEL (BLADE) ×2 IMPLANT
BNDG COHESIVE 6X5 TAN STRL LF (GAUZE/BANDAGES/DRESSINGS) ×2 IMPLANT
BNDG ELASTIC 6X5.8 VLCR STR LF (GAUZE/BANDAGES/DRESSINGS) ×2 IMPLANT
BNDG ESMARK 6X12 TAN STRL LF (GAUZE/BANDAGES/DRESSINGS) ×2 IMPLANT
BUR RADIUS 3.5 (BURR) IMPLANT
BUR RADIUS 4.0X18.5 (BURR) IMPLANT
CAST PADDING 6X4YD ST 30248 (SOFTGOODS) ×1
CHLORAPREP W/TINT 26 (MISCELLANEOUS) ×2 IMPLANT
COOLER POLAR GLACIER W/PUMP (MISCELLANEOUS) ×2 IMPLANT
COVER WAND RF STERILE (DRAPES) ×2 IMPLANT
CUFF TOURN SGL QUICK 24 (TOURNIQUET CUFF)
CUFF TOURN SGL QUICK 30 (TOURNIQUET CUFF)
CUFF TRNQT CYL 24X4X16.5-23 (TOURNIQUET CUFF) IMPLANT
CUFF TRNQT CYL 30X4X21-28X (TOURNIQUET CUFF) IMPLANT
DEVICE SUCT BLK HOLE OR FLOOR (MISCELLANEOUS) ×2 IMPLANT
DRAPE LEGGINS SURG 28X43 STRL (DRAPES) IMPLANT
DRAPE SPLIT 6X30 W/TAPE (DRAPES) ×2 IMPLANT
ELECT REM PT RETURN 9FT ADLT (ELECTROSURGICAL)
ELECTRODE REM PT RTRN 9FT ADLT (ELECTROSURGICAL) IMPLANT
GAUZE SPONGE 4X4 12PLY STRL (GAUZE/BANDAGES/DRESSINGS) ×2 IMPLANT
GLOVE BIOGEL PI IND STRL 8 (GLOVE) ×1 IMPLANT
GLOVE BIOGEL PI INDICATOR 8 (GLOVE) ×1
GLOVE SURG ORTHO 8.0 STRL STRW (GLOVE) ×4 IMPLANT
GOWN STRL REUS W/ TWL LRG LVL3 (GOWN DISPOSABLE) ×1 IMPLANT
GOWN STRL REUS W/ TWL XL LVL3 (GOWN DISPOSABLE) ×1 IMPLANT
GOWN STRL REUS W/TWL LRG LVL3 (GOWN DISPOSABLE) ×1
GOWN STRL REUS W/TWL XL LVL3 (GOWN DISPOSABLE) ×1
IV LACTATED RINGER IRRG 3000ML (IV SOLUTION) ×6
IV LR IRRIG 3000ML ARTHROMATIC (IV SOLUTION) ×6 IMPLANT
KIT TURNOVER KIT A (KITS) ×2 IMPLANT
MANIFOLD NEPTUNE II (INSTRUMENTS) ×2 IMPLANT
MAT ABSORB  FLUID 56X50 GRAY (MISCELLANEOUS) ×2
MAT ABSORB FLUID 56X50 GRAY (MISCELLANEOUS) ×2 IMPLANT
NDL MAYO CATGUT SZ5 (NEEDLE)
NDL SUT 5 .5 CRC TPR PNT MAYO (NEEDLE) IMPLANT
NEEDLE HYPO 22GX1.5 SAFETY (NEEDLE) ×2 IMPLANT
PACK ARTHROSCOPY KNEE (MISCELLANEOUS) ×2 IMPLANT
PAD ABD DERMACEA PRESS 5X9 (GAUZE/BANDAGES/DRESSINGS) ×4 IMPLANT
PAD WRAPON POLAR KNEE (MISCELLANEOUS) ×1 IMPLANT
PADDING CAST COTTON 6X4 ST (SOFTGOODS) ×1 IMPLANT
PENCIL ELECTRO HAND CTR (MISCELLANEOUS) IMPLANT
SET TUBE SUCT SHAVER OUTFL 24K (TUBING) ×2 IMPLANT
SET TUBE TIP INTRA-ARTICULAR (MISCELLANEOUS) ×2 IMPLANT
STRIP CLOSURE SKIN 1/2X4 (GAUZE/BANDAGES/DRESSINGS) IMPLANT
SUT ETHILON 3-0 FS-10 30 BLK (SUTURE) ×2
SUT MNCRL AB 4-0 PS2 18 (SUTURE) IMPLANT
SUT VIC AB 0 CT2 27 (SUTURE) IMPLANT
SUT VIC AB 2-0 CT2 27 (SUTURE) IMPLANT
SUTURE EHLN 3-0 FS-10 30 BLK (SUTURE) ×1 IMPLANT
TOWEL OR 17X26 4PK STRL BLUE (TOWEL DISPOSABLE) ×4 IMPLANT
TUBING ARTHRO INFLOW-ONLY STRL (TUBING) ×2 IMPLANT
WAND HAND CNTRL MULTIVAC 50 (MISCELLANEOUS) IMPLANT
WAND WEREWOLF FLOW 90D (MISCELLANEOUS) IMPLANT
WRAPON POLAR PAD KNEE (MISCELLANEOUS) ×2

## 2018-12-31 NOTE — Discharge Instructions (Addendum)
Arthroscopic Knee Surgery - Partial Meniscectomy   Post-Op Instructions   1. Bracing or crutches: Crutches will be provided at the time of discharge from the surgery center if you do not already have them.   2. Ice: You may be provided with a device [facility]) that allows you to ice the affected area effectively. Otherwise you can ice manually.    3. Driving:  Plan on not driving for at least two weeks. Please note that you are advised NOT to drive while taking narcotic pain medications as you may be impaired and unsafe to drive.   4. Activity: Ankle pumps several times an hour while awake to prevent blood clots. Weight bearing: as tolerated. Use crutches for as needed (usually ~1 week or less) until pain allows you to ambulate without a limp. Bending and straightening the knee is unlimited. Elevate knee above heart level as much as possible for one week. Avoid standing more than 5 minutes (consecutively) for the first week.  Avoid long distance travel for 2 weeks.  5. Medications:  - You have been provided a prescription for narcotic pain medicine. After surgery, take 1-2 narcotic tablets every 4 hours if needed for severe pain.  You should not need this beyond 2-3 days after surgery. - You may take up to 3000mg /day of tylenol (acetaminophen). You can take 1000mg  3x/day. Please check your narcotic. If you have acetaminophen in your narcotic (each tablet will be 325mg ), be careful not to exceed a total of 3000mg /day of acetaminophen.  - A prescription for anti-nausea medication will be provided in case the narcotic medicine or anesthesia causes nausea - take 1 tablet every 6 hours only if nauseated.  - Can resume home Celebrex the day after surgery - Take enteric coated aspirin 325 mg once daily for 2 weeks to prevent blood clots.    6. Bandages: The physical therapist should change the bandages at the first post-op appointment. If needed, the dressing supplies have been provided to you.   7.  Physical Therapy: 1-2 times per week for 6 weeks. Therapy typically starts on post operative Day 3 or 4. You have been provided an order for physical therapy. The therapist will provide home exercises.   8. Work: May return to full work usually around 2 weeks after 1st post-operative visit. May do light duty/desk job in approximately 1-2 weeks when off of narcotics, pain is well-controlled, and swelling has decreased. Labor intensive jobs may require 4-6 weeks to return.      9. Post-Op Appointments: Your first post-op appointment will be with Dr. Posey Pronto in approximately 2 weeks time.    If you find that they have not been scheduled please call the Orthopaedic Appointment front desk at [phone].  AMBULATORY SURGERY  DISCHARGE INSTRUCTIONS   1) The drugs that you were given will stay in your system until tomorrow so for the next 24 hours you should not:  A) Drive an automobile B) Make any legal decisions C) Drink any alcoholic beverage   2) You may resume regular meals tomorrow.  Today it is better to start with liquids and gradually work up to solid foods.  You may eat anything you prefer, but it is better to start with liquids, then soup and crackers, and gradually work up to solid foods.   3) Please notify your doctor immediately if you have any unusual bleeding, trouble breathing, redness and pain at the surgery site, drainage, fever, or pain not relieved by medication.  Additional Instructions: ° ° ° ° ° ° ° °Please contact your physician with any problems or Same Day Surgery at [phone], Monday through Friday 6 am to 4 pm, or [place] [facility] [place] Main number at [phone]. ° ° ° ° °

## 2018-12-31 NOTE — Anesthesia Procedure Notes (Signed)
Procedure Name: LMA Insertion Date/Time: 12/31/2018 7:44 AM Performed by: Eben Burow, CRNA Pre-anesthesia Checklist: Patient identified, Emergency Drugs available, Suction available and Patient being monitored Patient Re-evaluated:Patient Re-evaluated prior to induction Oxygen Delivery Method: Circle system utilized Preoxygenation: Pre-oxygenation with 100% oxygen Induction Type: IV induction LMA: LMA inserted LMA Size: 5.0 Number of attempts: 1 Placement Confirmation: positive ETCO2 and breath sounds checked- equal and bilateral Tube secured with: Tape Dental Injury: Teeth and Oropharynx as per pre-operative assessment

## 2018-12-31 NOTE — Transfer of Care (Signed)
Immediate Anesthesia Transfer of Care Note  Patient: Samuel Crawford  Procedure(s) Performed: KNEE ARTHROSCOPY WITH MEDIAL MENISECTOMY, LOOSE BODY REMOVAL, PARTIAL SYNOVECTOMY FOR PSORITATIC ARTHRITIS, PARTIAL MEDIAL MENISECTOMY (Left Knee)  Patient Location: PACU  Anesthesia Type:General  Level of Consciousness: awake, alert , oriented and patient cooperative  Airway & Oxygen Therapy: Patient Spontanous Breathing and Patient connected to nasal cannula oxygen  Post-op Assessment: Report given to RN and Post -op Vital signs reviewed and stable  Post vital signs: Reviewed and stable  Last Vitals:  Vitals Value Taken Time  BP 124/67 12/31/18 0906  Temp 36.2 C 12/31/18 0906  Pulse 79 12/31/18 0906  Resp 18 12/31/18 0906  SpO2 100 % 12/31/18 0906  Vitals shown include unvalidated device data.  Last Pain:  Vitals:   12/31/18 0610  TempSrc: Oral  PainSc: 0-No pain      Patients Stated Pain Goal: 0 ([id number])  Complications: No apparent anesthesia complications

## 2018-12-31 NOTE — Op Note (Signed)
Operative Note    SURGERY DATE: 12/31/2018   PRE-OP DIAGNOSIS:  1.  Left medial meniscus tear 2.  Left knee loose body 3.  Left knee synovitis of the medial, lateral, and patellofemoral compartments 4.  Left knee rheumatoid arthritis   POST-OP DIAGNOSIS:  1.  Left medial meniscus tear 2.  Left knee loose body 3.  Left knee synovitis of the medial, lateral, and patellofemoral compartments 4.  Left knee rheumatoid arthritis   PROCEDURES:  1.  Left knee arthroscopy, partial medial meniscectomy 2.  Left knee arthroscopic loose body removal 3.  Left knee complete synovectomy including the lateral, medial, and patellofemoral compartments.   SURGEON: Cato Mulligan, MD   ANESTHESIA: Gen   ESTIMATED BLOOD LOSS: minimal   TOTAL IV FLUIDS: per anesthesia   INDICATION(S):  Samuel Crawford is a 68 y.o. male with signs and symptoms as well as MRI finding of knee intra-articular loose body, synovitis, and possible medial meniscus tear.  Additionally, he has a history of psoriatic arthritis, for which she normally takes immunosuppressants, but has not recently due to concerns of immune weakening with COVID.  He has been taking Celebrex with partial relief of his symptoms.  He notes that he has had increasing pain even prior to coming off of immunosuppressive medications.  He has failed nonoperative management in the form of medications, activity modifications, and corticosteroid injection.  After discussion of risks, benefits, and alternatives to surgery, the patient elected to proceed.   OPERATIVE FINDINGS:    Examination under anesthesia: A careful examination under anesthesia was performed.  Passive range of motion was: Hyperextension: 1.  Extension: 0.  Flexion: 130.  Lachman: normal. Pivot Shift: normal.  Posterior drawer: normal.  Varus stability in full extension: normal.  Varus stability in 30 degrees of flexion: normal.  Valgus stability in full extension: normal.  Valgus stability in 30  degrees of flexion: normal.   Intra-operative findings: A thorough arthroscopic examination of the knee was performed.  The findings are: 1. Suprapatellar pouch: Significant synovitis 2. Undersurface of median ridge: Grade 2 degenerative changes 3. Medial patellar facet: Grade 2 degenerative changes 4. Lateral patellar facet: Grade 1 softening 5. Trochlea: Diffuse grade 2 degenerative changes 6. Lateral gutter/popliteus tendon: Significant synovitis 7. Hoffa's fat pad: Inflamed and synovitic 8. Medial gutter/plica: Significant synovitis 9. ACL: Normal with approximately 7 x 6 mm calcific loose body anterior to the ACL 10. PCL: Normal 11. Medial meniscus: Small horizontal tear of the posterior horn involving approximately 20% of the meniscus width.  There is evidence of prior partial meniscectomy of the posterior horn/body junction affecting approximately 30-40% of the meniscus width 12. Medial compartment cartilage: Diffuse grade 2 degenerative changes to the medial femoral condyle and tibial plateau 13. Lateral meniscus: Normal 14. Lateral compartment cartilage: Grade 1-2 degenerative changes to the tibial plateau and lateral femoral condyle   OPERATIVE REPORT:     I identified Samuel Crawford in the pre-operative holding area. I marked the operative knee with my initials. I reviewed the risks and benefits of the proposed surgical intervention and the patient wished to proceed. The patient was transferred to the operative suite and placed in the supine position with all bony prominences padded.  Anesthesia was administered. Appropriate IV antibiotics were administered prior to incision. The extremity was then prepped and draped in standard fashion. A time out was performed confirming the correct extremity, correct patient, and correct procedure.   Arthroscopy portals were marked. Local anesthetic was injected to the  planned portal sites. The anterolateral portal was established with an 11  blade.    The arthroscope was placed in the anterolateral portal and then into the suprapatellar pouch.  A diagnostic knee scope was completed with the above findings. The medial meniscus tear and loose body was identified.   Next the medial portal was established under needle localization.  The loose body was removed with an arthroscopic grasper.  It measured approximately 7 x 6 mm and appeared to be calcific in nature.  Next, the MCL was pie-crusted to improve visualization of the posterior horn. The meniscal tear was debrided using an arthroscopic biter and an oscillating shaver until the meniscus had stable borders.  After debridement, there was an approximately 11 mm of posterior horn meniscus width left.  A complete synovectomy was performed next given the patient's history of psoriatic arthritis and inflammation.  A combination of an ArthroCare wand and oscillating shaver was used to debride inflamed synovium about Hoffa's fat pad, the medial gutter, the lateral gutter, and the patellofemoral compartment, especially the suprapatellar pouch. Arthroscopic fluid was then removed from the joint.   The portals were closed with 3-0 Nylon suture. Sterile dressings included Xeroform, 4x4s, Sof-Rol, and Bias wrap. A Polarcare was placed.  The patient was then awakened and taken to the PACU hemodynamically stable without complication.     POSTOPERATIVE PLAN: The patient will be discharged home today once they meet PACU criteria. Aspirin 325 mg daily was prescribed for 2 weeks for DVT prophylaxis.  Physical therapy will start on POD#3-4. Weight-bearing as tolerated. Follow up in 2 weeks per protocol.

## 2018-12-31 NOTE — Anesthesia Post-op Follow-up Note (Signed)
Anesthesia QCDR form completed.        

## 2018-12-31 NOTE — H&P (Signed)
Paper H&P to be scanned into permanent record. H&P reviewed. No significant changes noted.  
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
## 2019-01-01 ENCOUNTER — Encounter: Payer: Self-pay | Admitting: Orthopedic Surgery

## 2019-01-01 LAB — SURGICAL PATHOLOGY

## 2019-01-01 NOTE — Anesthesia Postprocedure Evaluation (Signed)
Anesthesia Post Note  Patient: [name]  Procedure(s) Performed: KNEE ARTHROSCOPY WITH MEDIAL MENISECTOMY, LOOSE BODY REMOVAL, PARTIAL SYNOVECTOMY FOR PSORITATIC ARTHRITIS, PARTIAL MEDIAL MENISECTOMY (Left Knee)  Patient location during evaluation: PACU Anesthesia Type: General Level of consciousness: awake and alert Pain management: pain level controlled Vital Signs Assessment: post-procedure vital signs reviewed and stable Respiratory status: spontaneous breathing, nonlabored ventilation, respiratory function stable and patient connected to nasal cannula oxygen Cardiovascular status: blood pressure returned to baseline and stable Postop Assessment: no apparent nausea or vomiting Anesthetic complications: no     Last Vitals:  Vitals:   12/31/18 0945 12/31/18 0959  BP: 126/60 (!) 126/59  Pulse: 78 74  Resp: 16 16  Temp: (!) 36.4 C   SpO2: 96% 97%    Last Pain:  Vitals:   12/31/18 0959  TempSrc:   PainSc: 1                  Martha Clan

## 2019-01-03 DIAGNOSIS — M25562 Pain in left knee: Secondary | ICD-10-CM | POA: Diagnosis not present

## 2019-01-03 DIAGNOSIS — R29898 Other symptoms and signs involving the musculoskeletal system: Secondary | ICD-10-CM | POA: Diagnosis not present

## 2019-01-03 DIAGNOSIS — M25662 Stiffness of left knee, not elsewhere classified: Secondary | ICD-10-CM | POA: Diagnosis not present

## 2019-01-09 DIAGNOSIS — M25562 Pain in left knee: Secondary | ICD-10-CM | POA: Diagnosis not present

## 2019-01-17 DIAGNOSIS — M25562 Pain in left knee: Secondary | ICD-10-CM | POA: Diagnosis not present

## 2019-02-11 ENCOUNTER — Other Ambulatory Visit: Payer: Self-pay

## 2019-02-25 ENCOUNTER — Other Ambulatory Visit: Payer: Self-pay | Admitting: Internal Medicine

## 2019-02-25 DIAGNOSIS — I10 Essential (primary) hypertension: Secondary | ICD-10-CM

## 2019-02-26 ENCOUNTER — Encounter: Payer: Self-pay | Admitting: Internal Medicine

## 2019-04-05 ENCOUNTER — Ambulatory Visit (INDEPENDENT_AMBULATORY_CARE_PROVIDER_SITE_OTHER): Payer: Medicare Other | Admitting: Adult Health

## 2019-04-05 ENCOUNTER — Other Ambulatory Visit: Payer: Self-pay

## 2019-04-05 ENCOUNTER — Encounter: Payer: Self-pay | Admitting: Adult Health

## 2019-04-05 VITALS — BP 130/78 | HR 65 | Temp 97.0°F | Ht 69.0 in | Wt 233.8 lb

## 2019-04-05 DIAGNOSIS — G4733 Obstructive sleep apnea (adult) (pediatric): Secondary | ICD-10-CM | POA: Diagnosis not present

## 2019-04-05 DIAGNOSIS — Z9989 Dependence on other enabling machines and devices: Secondary | ICD-10-CM | POA: Diagnosis not present

## 2019-04-05 NOTE — Addendum Note (Signed)
Addended by: Maryanna Shape A on: 04/05/2019 04:08 PM   Modules accepted: Orders
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
## 2019-04-05 NOTE — Assessment & Plan Note (Signed)
Excellent compliance on CPAP, will adjust CPAP to see if we can decrease the number of events  Plan  Patient Instructions  Continue on CPAP at bedtime Keep up the good work Change CPAP pressure 8 to 15 cm H2O. CPAP download in 4 weeks Follow-up in 1 year with Dr. Mortimer Fries and As needed

## 2019-04-05 NOTE — Patient Instructions (Signed)
Continue on CPAP at bedtime Keep up the good work Change CPAP pressure 8 to 15 cm H2O. CPAP download in 4 weeks Follow-up in 1 year with Dr. Mortimer Fries and As needed

## 2019-04-09 ENCOUNTER — Encounter: Payer: Self-pay | Admitting: Internal Medicine

## 2019-04-09 ENCOUNTER — Ambulatory Visit (INDEPENDENT_AMBULATORY_CARE_PROVIDER_SITE_OTHER): Payer: Medicare Other | Admitting: Internal Medicine

## 2019-04-09 VITALS — BP 130/78 | HR 54 | Ht 68.5 in | Wt 228.0 lb

## 2019-04-09 DIAGNOSIS — I158 Other secondary hypertension: Secondary | ICD-10-CM

## 2019-04-09 DIAGNOSIS — Z9989 Dependence on other enabling machines and devices: Secondary | ICD-10-CM | POA: Diagnosis not present

## 2019-04-09 DIAGNOSIS — L4050 Arthropathic psoriasis, unspecified: Secondary | ICD-10-CM

## 2019-04-09 DIAGNOSIS — Z0000 Encounter for general adult medical examination without abnormal findings: Secondary | ICD-10-CM | POA: Diagnosis not present

## 2019-04-09 DIAGNOSIS — G4733 Obstructive sleep apnea (adult) (pediatric): Secondary | ICD-10-CM | POA: Diagnosis not present

## 2019-04-09 NOTE — Assessment & Plan Note (Signed)
Continue CPAP He will follow with pulmonology
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
## 2019-04-09 NOTE — Assessment & Plan Note (Signed)
Controlled on HCTZ Reinforced DASH diet Will check CMET

## 2019-04-09 NOTE — Assessment & Plan Note (Signed)
Continue Humira, Celebrex and Folic acid CBC and CMET today

## 2019-04-09 NOTE — Patient Instructions (Signed)
Health Maintenance After Age 68 After age 68, you are at a higher risk for certain long-term diseases and infections as well as injuries from falls. Falls are a major cause of broken bones and head injuries in people who are older than age 68. Getting regular preventive care can help to keep you healthy and well. Preventive care includes getting regular testing and making lifestyle changes as recommended by your health care provider. Talk with your health care provider about:  Which screenings and tests you should have. A screening is a test that checks for a disease when you have no symptoms.  A diet and exercise plan that is right for you. What should I know about screenings and tests to prevent falls? Screening and testing are the best ways to find a health problem early. Early diagnosis and treatment give you the best chance of managing medical conditions that are common after age 68. Certain conditions and lifestyle choices may make you more likely to have a fall. Your health care provider may recommend:  Regular vision checks. Poor vision and conditions such as cataracts can make you more likely to have a fall. If you wear glasses, make sure to get your prescription updated if your vision changes.  Medicine review. Work with your health care provider to regularly review all of the medicines you are taking, including over-the-counter medicines. Ask your health care provider about any side effects that may make you more likely to have a fall. Tell your health care provider if any medicines that you take make you feel dizzy or sleepy.  Osteoporosis screening. Osteoporosis is a condition that causes the bones to get weaker. This can make the bones weak and cause them to break more easily.  Blood pressure screening. Blood pressure changes and medicines to control blood pressure can make you feel dizzy.  Strength and balance checks. Your health care provider may recommend certain tests to check your  strength and balance while standing, walking, or changing positions.  Foot health exam. Foot pain and numbness, as well as not wearing proper footwear, can make you more likely to have a fall.  Depression screening. You may be more likely to have a fall if you have a fear of falling, feel emotionally low, or feel unable to do activities that you used to do.  Alcohol use screening. Using too much alcohol can affect your balance and may make you more likely to have a fall. What actions can I take to lower my risk of falls? General instructions  Talk with your health care provider about your risks for falling. Tell your health care provider if: ? You fall. Be sure to tell your health care provider about all falls, even ones that seem minor. ? You feel dizzy, sleepy, or off-balance.  Take over-the-counter and prescription medicines only as told by your health care provider. These include any supplements.  Eat a healthy diet and maintain a healthy weight. A healthy diet includes low-fat dairy products, low-fat (lean) meats, and fiber from whole grains, beans, and lots of fruits and vegetables. Home safety  Remove any tripping hazards, such as rugs, cords, and clutter.  Install safety equipment such as grab bars in bathrooms and safety rails on stairs.  Keep rooms and walkways well-lit. Activity   Follow a regular exercise program to stay fit. This will help you maintain your balance. Ask your health care provider what types of exercise are appropriate for you.  If you need a cane or   walker, use it as recommended by your health care provider.  Wear supportive shoes that have nonskid soles. Lifestyle  Do not drink alcohol if your health care provider tells you not to drink.  If you drink alcohol, limit how much you have: ? 0-1 drink a day for women. ? 0-2 drinks a day for men.  Be aware of how much alcohol is in your drink. In the [place] one drink equals one typical bottle of beer (12  oz), one-half glass of wine (5 oz), or one shot of hard liquor (1 oz).  Do not use any products that contain nicotine or tobacco, such as cigarettes and e-cigarettes. If you need help quitting, ask your health care provider. Summary  Having a healthy lifestyle and getting preventive care can help to protect your health and wellness after age 68.  Screening and testing are the best way to find a health problem early and help you avoid having a fall. Early diagnosis and treatment give you the best chance for managing medical conditions that are more common for people who are older than age 68.  Falls are a major cause of broken bones and head injuries in people who are older than age 68. Take precautions to prevent a fall at home.  Work with your health care provider to learn what changes you can make to improve your health and wellness and to prevent falls. This information is not intended to replace advice given to you by your health care provider. Make sure you discuss any questions you have with your health care provider. Document Revised: 06/28/2018 Document Reviewed: 01/18/2017 Elsevier Patient Education  2020 [employer] Inc[employer]  

## 2019-04-17 DIAGNOSIS — Z20828 Contact with and (suspected) exposure to other viral communicable diseases: Secondary | ICD-10-CM | POA: Diagnosis not present

## 2019-04-17 DIAGNOSIS — E785 Hyperlipidemia, unspecified: Secondary | ICD-10-CM | POA: Diagnosis not present

## 2019-04-17 DIAGNOSIS — Z125 Encounter for screening for malignant neoplasm of prostate: Secondary | ICD-10-CM | POA: Diagnosis not present

## 2019-04-24 ENCOUNTER — Encounter: Payer: Self-pay | Admitting: Internal Medicine

## 2019-05-04 IMAGING — DX DG CHEST 2V
2 series · 2 of 2 positions shown · non-contrast
Comparison: None.

CLINICAL DATA: Palpitations.

EXAM:
CHEST - 2 VIEW

[chest pa]
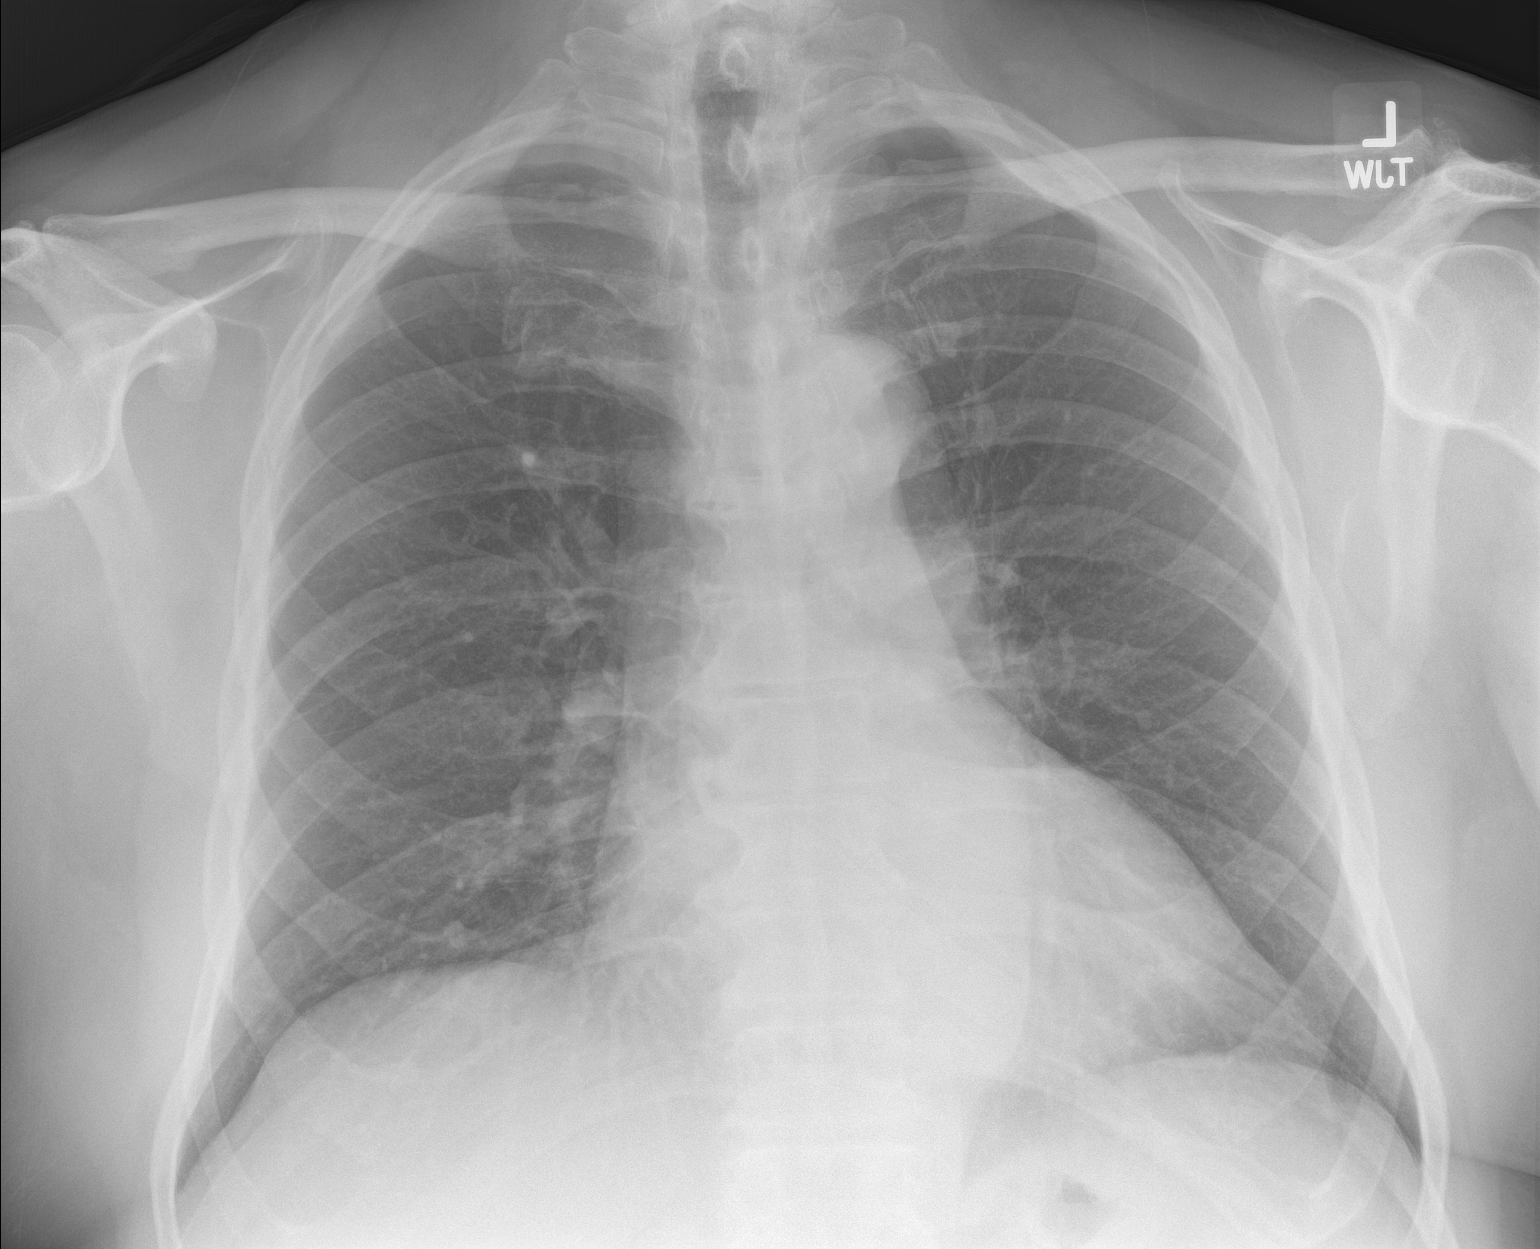

[chest lat]
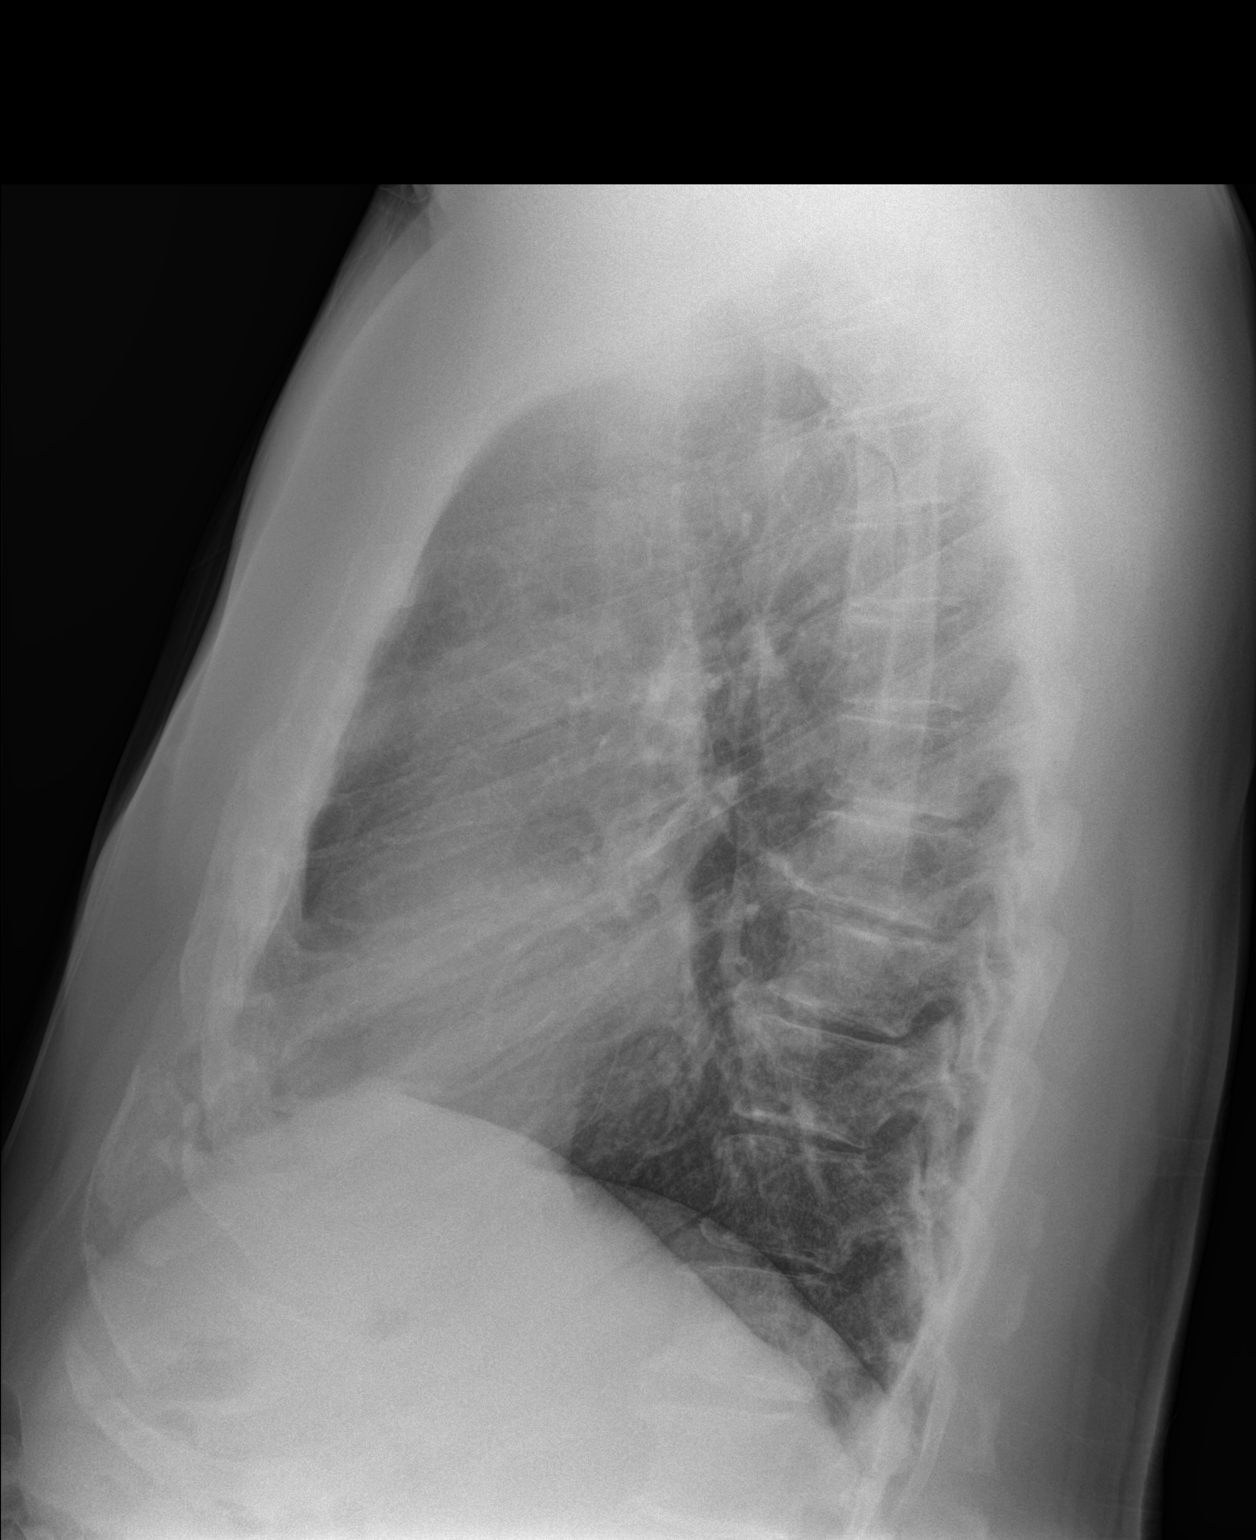

[2 of 2 positions shown; findings below may reference images not displayed]

FINDINGS: The heart size and mediastinal contours are within normal limits.
Both lungs are clear. The visualized skeletal structures are
unremarkable.
IMPRESSION: No active cardiopulmonary disease.

## 2019-05-13 ENCOUNTER — Encounter: Payer: Self-pay | Admitting: Internal Medicine

## 2019-05-16 ENCOUNTER — Telehealth: Payer: Self-pay | Admitting: Adult Health

## 2019-05-16 DIAGNOSIS — Z9989 Dependence on other enabling machines and devices: Secondary | ICD-10-CM

## 2019-05-16 DIAGNOSIS — G4733 Obstructive sleep apnea (adult) (pediatric): Secondary | ICD-10-CM

## 2019-05-16 NOTE — Telephone Encounter (Signed)
Patient seen 1.15.21 by TP CPAP pressure settings changed from auto 5-8 @ AHI 13/hr to auto 5-20   30 day download received from [facility] pressure setting of 5-20 shows an AHI 8.2/hr  Per TP: IMPROVED.  It looks like his pressure typically stayed at 11 so would adjust pressure setting to auto 5-15cm   Called spoke with patient, discussed the above.  Patient voiced his understanding.  Nothing further needed.  Order placed.  Will sign off.

## 2019-05-26 ENCOUNTER — Other Ambulatory Visit: Payer: Self-pay | Admitting: Internal Medicine

## 2019-05-26 DIAGNOSIS — I10 Essential (primary) hypertension: Secondary | ICD-10-CM

## 2019-06-05 ENCOUNTER — Other Ambulatory Visit: Payer: Self-pay

## 2019-06-05 ENCOUNTER — Ambulatory Visit (INDEPENDENT_AMBULATORY_CARE_PROVIDER_SITE_OTHER): Payer: Medicare Other

## 2019-06-05 ENCOUNTER — Ambulatory Visit: Payer: Medicare Other | Admitting: Internal Medicine

## 2019-06-05 DIAGNOSIS — Z23 Encounter for immunization: Secondary | ICD-10-CM | POA: Diagnosis not present

## 2019-06-05 NOTE — Progress Notes (Signed)
Per orders of Allie Bossier, NP, injection of prevnar 13 given by Randall An. Patient tolerated injection well.

## 2019-08-12 ENCOUNTER — Encounter: Payer: Self-pay | Admitting: Internal Medicine

## 2019-08-12 ENCOUNTER — Ambulatory Visit: Payer: Medicare Other | Attending: Internal Medicine

## 2019-08-12 DIAGNOSIS — Z20822 Contact with and (suspected) exposure to covid-19: Secondary | ICD-10-CM

## 2019-08-13 LAB — SARS-COV-2, NAA 2 DAY TAT

## 2019-08-13 LAB — NOVEL CORONAVIRUS, NAA: SARS-CoV-2, NAA: NOT DETECTED

## 2019-10-25 DIAGNOSIS — Z20828 Contact with and (suspected) exposure to other viral communicable diseases: Secondary | ICD-10-CM | POA: Diagnosis not present

## 2019-10-29 DIAGNOSIS — Z20828 Contact with and (suspected) exposure to other viral communicable diseases: Secondary | ICD-10-CM | POA: Diagnosis not present

## 2019-11-06 DIAGNOSIS — Z23 Encounter for immunization: Secondary | ICD-10-CM | POA: Diagnosis not present

## 2019-11-15 DIAGNOSIS — D2261 Melanocytic nevi of right upper limb, including shoulder: Secondary | ICD-10-CM | POA: Diagnosis not present

## 2019-11-15 DIAGNOSIS — D225 Melanocytic nevi of trunk: Secondary | ICD-10-CM | POA: Diagnosis not present

## 2019-11-15 DIAGNOSIS — D2262 Melanocytic nevi of left upper limb, including shoulder: Secondary | ICD-10-CM | POA: Diagnosis not present

## 2019-11-15 DIAGNOSIS — D2271 Melanocytic nevi of right lower limb, including hip: Secondary | ICD-10-CM | POA: Diagnosis not present

## 2019-11-15 DIAGNOSIS — L400 Psoriasis vulgaris: Secondary | ICD-10-CM | POA: Diagnosis not present

## 2019-12-13 DIAGNOSIS — Z20828 Contact with and (suspected) exposure to other viral communicable diseases: Secondary | ICD-10-CM | POA: Diagnosis not present

## 2019-12-25 DIAGNOSIS — Z23 Encounter for immunization: Secondary | ICD-10-CM | POA: Diagnosis not present

## 2020-02-21 ENCOUNTER — Other Ambulatory Visit: Payer: Self-pay | Admitting: Internal Medicine

## 2020-02-21 DIAGNOSIS — I10 Essential (primary) hypertension: Secondary | ICD-10-CM

## 2020-02-24 NOTE — Telephone Encounter (Signed)
CPE reminder letter mailed

## 2020-04-07 ENCOUNTER — Encounter: Payer: Self-pay | Admitting: Internal Medicine

## 2020-04-09 ENCOUNTER — Other Ambulatory Visit: Payer: Self-pay

## 2020-04-09 ENCOUNTER — Ambulatory Visit (INDEPENDENT_AMBULATORY_CARE_PROVIDER_SITE_OTHER): Payer: Medicare Other | Admitting: Internal Medicine

## 2020-04-09 ENCOUNTER — Encounter: Payer: Self-pay | Admitting: Internal Medicine

## 2020-04-09 VITALS — BP 160/70 | HR 92 | Temp 97.0°F | Ht 69.0 in | Wt 222.4 lb

## 2020-04-09 DIAGNOSIS — G4733 Obstructive sleep apnea (adult) (pediatric): Secondary | ICD-10-CM | POA: Diagnosis not present

## 2020-04-09 NOTE — Patient Instructions (Addendum)
Continue CPAP as prescribed  A++++ KEEP THE GREAT WORK!!

## 2020-04-09 NOTE — Progress Notes (Signed)
[facility] Consultation       Date: 04/09/2020  MRN# [id number] Samuel Crawford 06-09-1950   The patient is 70 year old male, with a history of obstructive sleep apnea. He continues to do well CPAP, he is using it every night, he is more awake during the day.  He remains more awake during the day, he is not snoring. He is getting new supplies every 3 months and cleans them every week.   **Download dated 03/06/2018-04/04/2018>> raw data personally reviewed, usage greater than 4 hours is 30/30 days.  Average usage on days used is 7 hours 37 minutes, pressure range 5-20.  Leaks are slightly elevated but within normal limits.  Pressure median 7, 95th percentile pressure 10, maximum pressure 12.  Residual AHI is 2.6.  Overall this shows excellent control of obstructive sleep apnea with excellent compliance with CPAP. Download data review, 30 days as of 04/09/17.  Usage is 30/30 days.  Average usage on days used is 7 hours 30 minutes.  Setting is auto, 5-20.  Median pressure is 8.1, 95th percentile pressure is 11.6, maximum pressure is 13.  Residual AHI is 3.8.  Review of outside sleep studies; -Home sleep study 12/12/16; AHI of 24. -Baseline sleep study. 05/27/2005, AHI was 40, increased to 52 when supine. -CPAP titration 08/06/2005; CPAP titrated to a pressure level of 8. Overall these tests show severe affective sleep apnea, adequately treated, at a CPAP level of 8     CC Follow up OSA  HPI Patient with severe OSA Doing well with therapy No acute issues No signs of CHF      Medication:    Current Outpatient Medications:  .  Adalimumab (HUMIRA) 40 MG/0.4ML PSKT, Inject into the skin every 30 (thirty) days., Disp: , Rfl:  .  celecoxib (CELEBREX) 100 MG capsule, Take 100 mg by mouth 2 (two) times daily. , Disp: , Rfl:  .  hydrochlorothiazide (HYDRODIURIL) 25 MG tablet, Take 1 tablet (25 mg total) by mouth daily. MUST SCHEDULE PHYSICAL EXAM, Disp: 90 tablet, Rfl: 0 .   hydrocortisone cream 1 %, Apply 1 application topically 2 (two) times daily as needed for itching (psoriasis)., Disp: , Rfl:  .  loratadine (CLARITIN) 10 MG tablet, Take 10 mg by mouth at bedtime. , Disp: , Rfl:  .  Multiple Vitamin (MULTIVITAMIN) tablet, Take 1 tablet by mouth daily., Disp: , Rfl:  .  Omega-3 Fatty Acids (FISH OIL) 1200 MG CPDR, Take 1,200 mg by mouth daily., Disp: , Rfl:    Allergies:  Shellfish allergy, Amoxicillin, Betadine [povidone iodine], and Other   Review of Systems:  Gen:  Denies  fever, sweats, chills weight loss  HEENT: Denies blurred vision, double vision, ear pain, eye pain, hearing loss, nose bleeds, sore throat Cardiac:  No dizziness, chest pain or heaviness, chest tightness,edema, No JVD Resp:   No cough, -sputum production, -shortness of breath,-wheezing, -hemoptysis,  Gi: Denies swallowing difficulty, stomach pain, nausea or vomiting, diarrhea, constipation, bowel incontinence Other:  All other systems negative  BP (!) 160/70 (BP Location: Left Arm, Patient Position: Sitting, Cuff Size: Large)   Pulse 92   Temp (!) 97 F (36.1 C) (Temporal)   Ht 5\' 9"  (1.753 m)   Wt 222 lb 6.4 oz (100.9 kg)   SpO2 94%   BMI 32.84 kg/m   Physical Examination:   General Appearance: No distress  Neuro:without focal findings,  speech normal,  HEENT: PERRLA, EOM intact.   Pulmonary: normal breath sounds, No wheezing.  CardiovascularNormal S1,S2.  No m/r/g.   Skin:   warm, no rashes, no ecchymosis  Extremities: normal, no cyanosis, clubbing. PSYCHIATRIC: Mood, affect within normal limits.   ALL OTHER ROS ARE NEGATIVE    Assessment and Plan:  Obstructive sleep apnea. CPAP 5-20 cm h20 Patient uses and benefits from therapy Patient with excellent Compliance report 100% days and >4 hrs  AHI reduced dwont o 2.3    Ho Pulmonary embolism. PROVOKED Patient developed a DVT from PICC line, then developed PE. Is now off of anticoagulation.   COVID-19  EDUCATION: The signs and symptoms of COVID-19 were discussed with the patient and how to seek care for testing.  The importance of social distancing was discussed today. Hand Washing Techniques and avoid touching face was advised.     MEDICATION ADJUSTMENTS/LABS AND TESTS ORDERED:    CURRENT MEDICATIONS REVIEWED AT LENGTH WITH PATIENT TODAY   Patient satisfied with Plan of action and management. All questions answered  Follow up in 1 year  Total time Spent 25 mins   Samuel Crawford, M.D.  [facility]  Medical Director [place] Director [facility] Cardio-Pulmonary Department

## 2020-05-18 ENCOUNTER — Encounter: Payer: Medicare Other | Admitting: Internal Medicine

## 2020-05-22 ENCOUNTER — Other Ambulatory Visit: Payer: Self-pay | Admitting: Internal Medicine

## 2020-05-22 DIAGNOSIS — I10 Essential (primary) hypertension: Secondary | ICD-10-CM

## 2020-07-11 IMAGING — US VENOUS DOPPLER ULTRASOUND OF LEFT LOWER EXTREMITY
1 series · 13 of 24 positions shown · non-contrast
Comparison: 02/20/2017 right

CLINICAL DATA: Swelling x1 month

EXAM:
LEFT LOWER EXTREMITY VENOUS DOPPLER ULTRASOUND
TECHNIQUE: Gray-scale sonography with compression, as well as color and duplex
ultrasound, were performed to evaluate the deep venous system from
the level of the common femoral vein through the popliteal and
proximal calf veins.

[Series 1: venous doppler ultrasound of left lower extremity · 52 acquisitions, 13 frames shown]
[im 1/52]
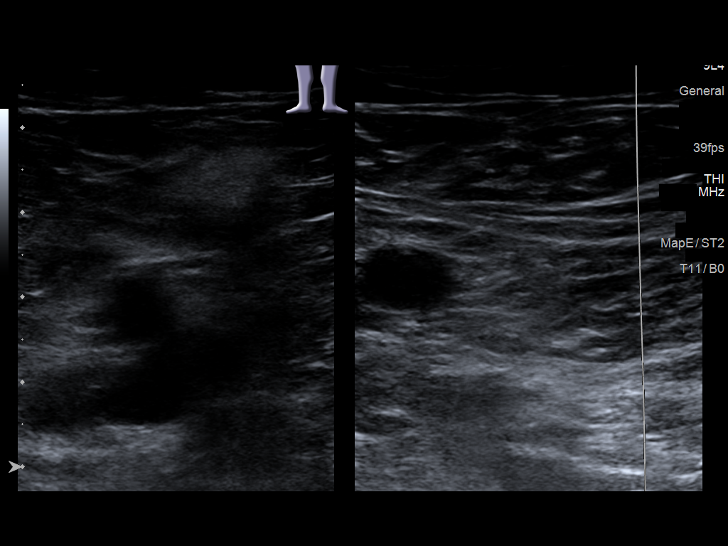
[im 5/52]
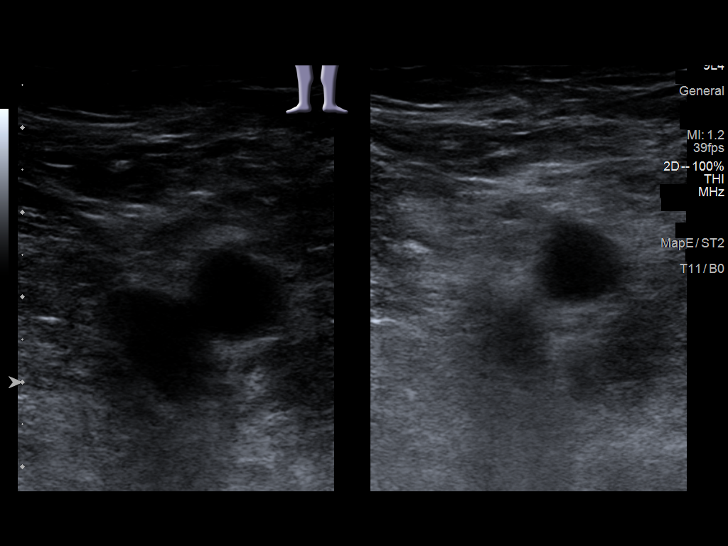
[im 9/52]
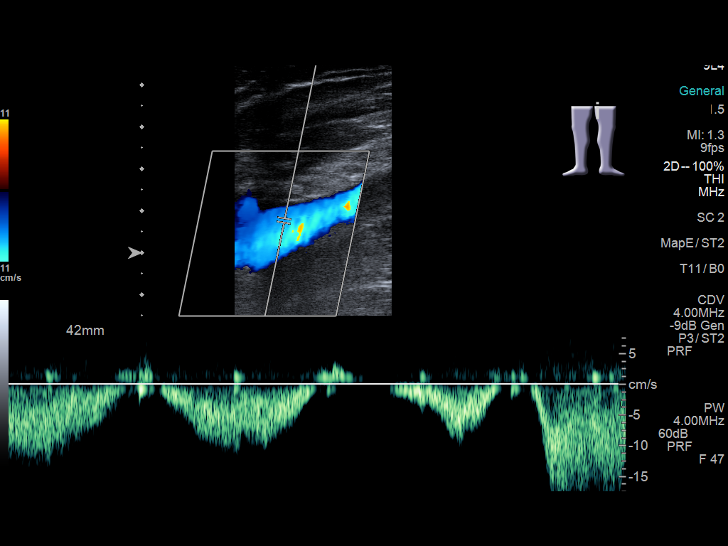
[im 14/52]
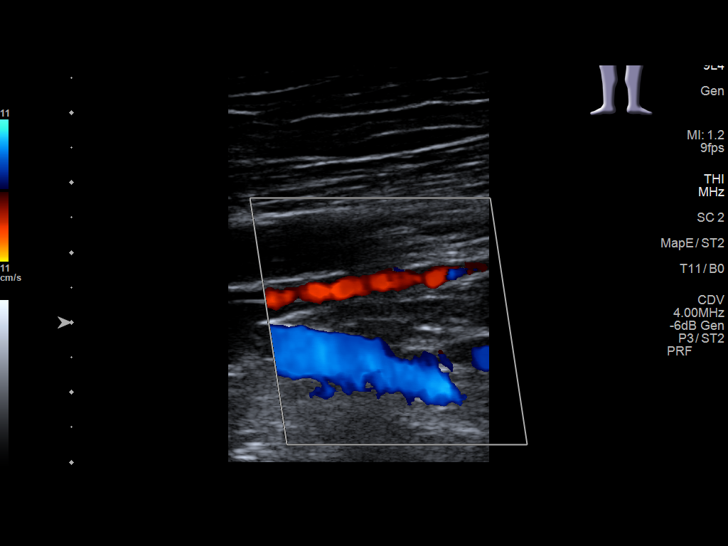
[im 18/52]
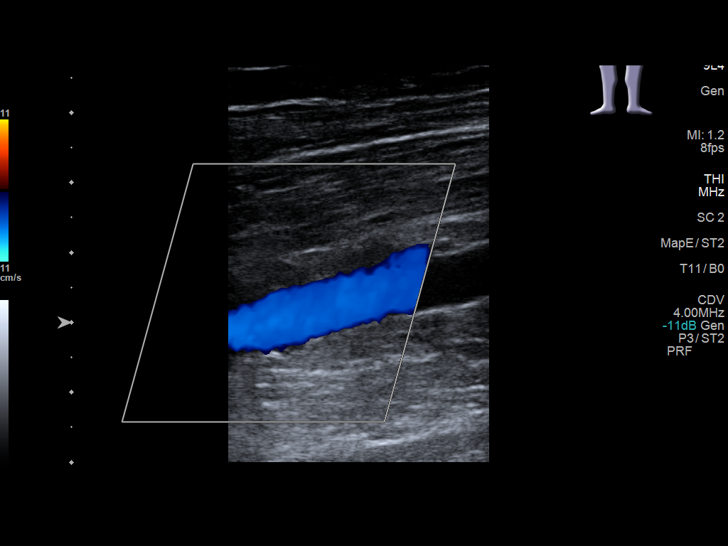
[im 23/52]
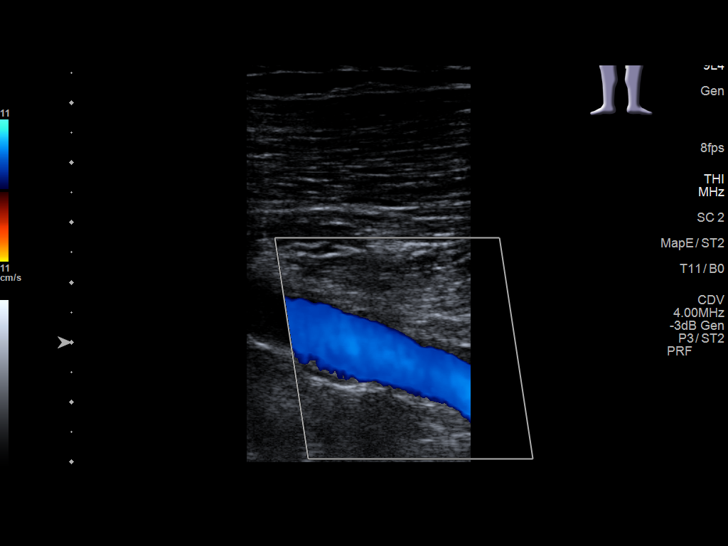
[im 29/52]
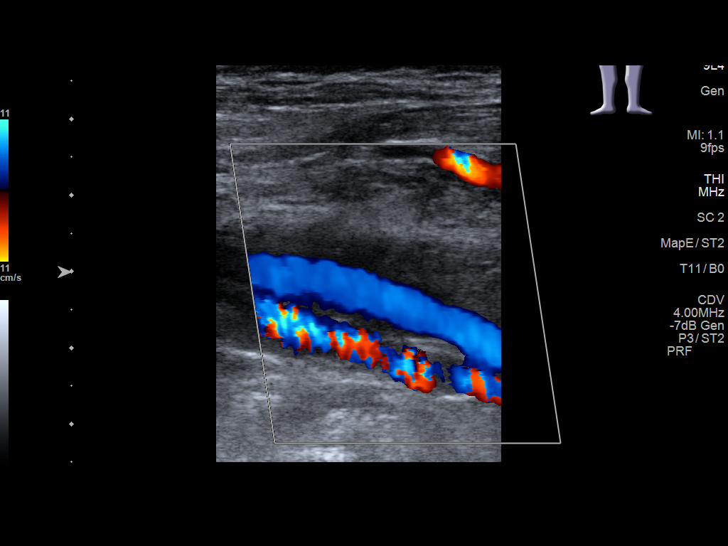
[im 32/52]
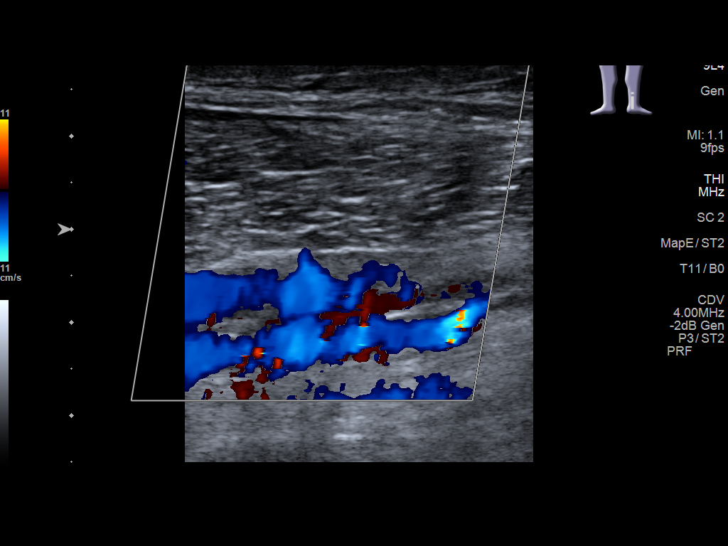
[im 36/52]
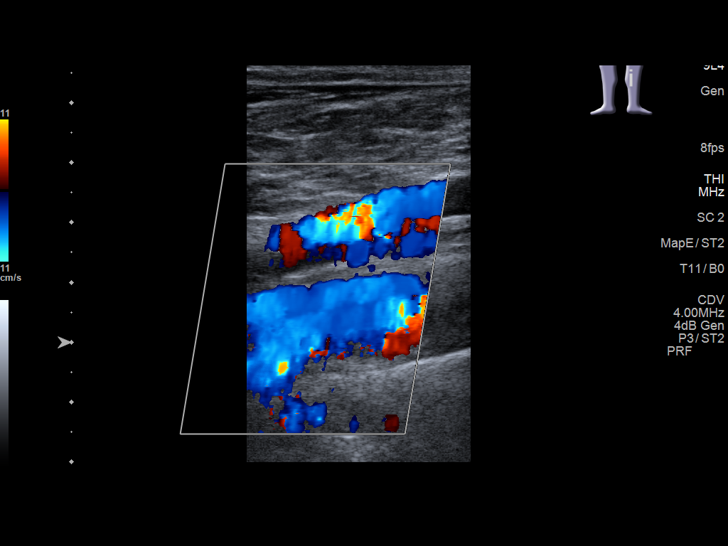
[im 40/52]
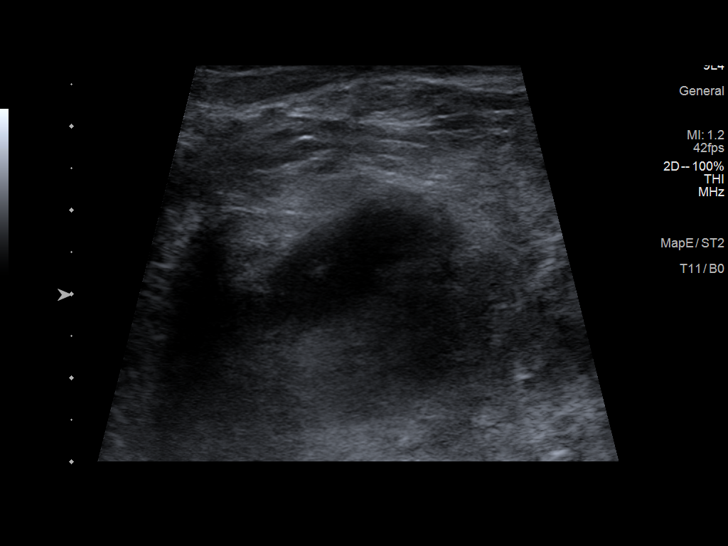
[im 45/52]
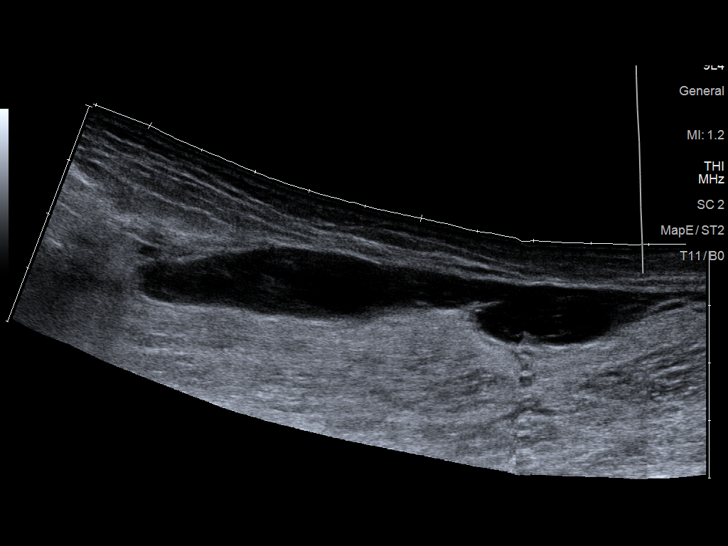
[im 49/52]
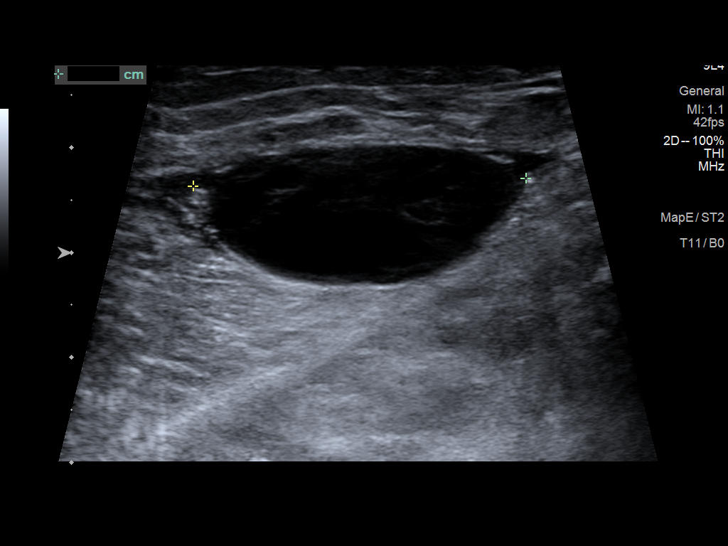
[im 52/52]
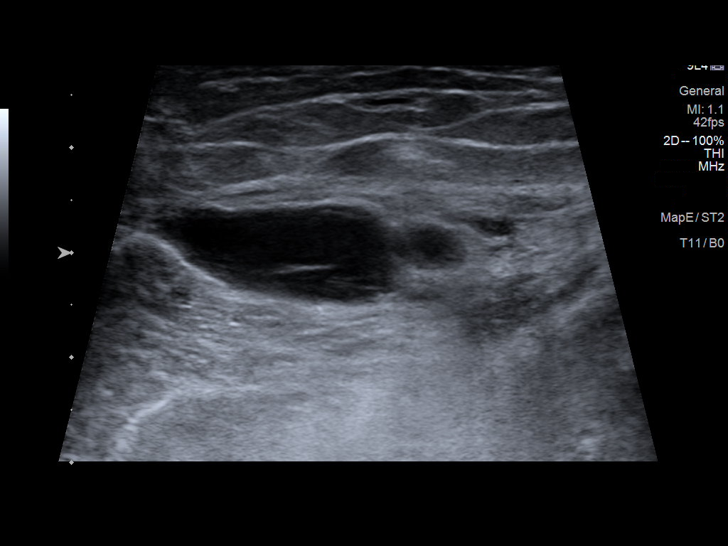

[13 of 24 positions shown; findings below may reference images not displayed]

FINDINGS: Normal compressibility of the common femoral, superficial femoral,
and popliteal veins, as well as the proximal calf veins. No filling
defects to suggest DVT on grayscale or color Doppler imaging.
Doppler waveforms show normal direction of venous flow, normal
respiratory phasicity and response to augmentation. Mildly complex
3.3 x 1.4 x 2.2 elongated fluid collection in the left posterior
popliteal fossa. There is a separate 9.3 x 1 x 3.2 cm elongated
fluid collection in the deep subcutaneous tissues of left calf.

Survey views of the contralateral common femoral vein are
unremarkable.
IMPRESSION: 1. No femoropopliteal and no calf DVT in the visualized calf veins.
If clinical symptoms are inconsistent or if there are persistent or
worsening symptoms, further imaging (possibly involving the iliac
veins) may be warranted.
2. Left Baker's cyst, possibly post rupture given the separate deep
subcutaneous fluid collection in the calf.

## 2020-07-21 ENCOUNTER — Encounter: Payer: Medicare Other | Admitting: Internal Medicine

## 2020-07-21 DIAGNOSIS — X32XXXA Exposure to sunlight, initial encounter: Secondary | ICD-10-CM | POA: Diagnosis not present

## 2020-07-21 DIAGNOSIS — L570 Actinic keratosis: Secondary | ICD-10-CM | POA: Diagnosis not present

## 2020-07-23 ENCOUNTER — Encounter: Payer: Self-pay | Admitting: Internal Medicine

## 2020-07-23 ENCOUNTER — Ambulatory Visit (INDEPENDENT_AMBULATORY_CARE_PROVIDER_SITE_OTHER): Payer: Medicare Other | Admitting: Internal Medicine

## 2020-07-23 ENCOUNTER — Other Ambulatory Visit: Payer: Self-pay

## 2020-07-23 VITALS — BP 140/55 | HR 58 | Temp 98.0°F | Resp 17 | Ht 69.0 in | Wt 228.2 lb

## 2020-07-23 DIAGNOSIS — Z9989 Dependence on other enabling machines and devices: Secondary | ICD-10-CM

## 2020-07-23 DIAGNOSIS — L4050 Arthropathic psoriasis, unspecified: Secondary | ICD-10-CM

## 2020-07-23 DIAGNOSIS — I10 Essential (primary) hypertension: Secondary | ICD-10-CM

## 2020-07-23 DIAGNOSIS — Z1211 Encounter for screening for malignant neoplasm of colon: Secondary | ICD-10-CM | POA: Diagnosis not present

## 2020-07-23 DIAGNOSIS — K219 Gastro-esophageal reflux disease without esophagitis: Secondary | ICD-10-CM

## 2020-07-23 DIAGNOSIS — Z0000 Encounter for general adult medical examination without abnormal findings: Secondary | ICD-10-CM | POA: Diagnosis not present

## 2020-07-23 DIAGNOSIS — G4733 Obstructive sleep apnea (adult) (pediatric): Secondary | ICD-10-CM

## 2020-07-23 DIAGNOSIS — E6609 Other obesity due to excess calories: Secondary | ICD-10-CM | POA: Diagnosis not present

## 2020-07-23 DIAGNOSIS — K5900 Constipation, unspecified: Secondary | ICD-10-CM | POA: Diagnosis not present

## 2020-07-23 DIAGNOSIS — Z6833 Body mass index (BMI) 33.0-33.9, adult: Secondary | ICD-10-CM | POA: Diagnosis not present

## 2020-07-23 MED ORDER — TRIAMTERENE-HCTZ 37.5-25 MG PO CAPS: 1.0000 | ORAL_CAPSULE | Freq: Every day | ORAL | 3 refills | Status: DC

## 2020-07-23 NOTE — Patient Instructions (Signed)
Health Maintenance After Age 69 After age 69, you are at a higher risk for certain long-term diseases and infections as well as injuries from falls. Falls are a major cause of broken bones and head injuries in people who are older than age 69. Getting regular preventive care can help to keep you healthy and well. Preventive care includes getting regular testing and making lifestyle changes as recommended by your health care provider. Talk with your health care provider about:  Which screenings and tests you should have. A screening is a test that checks for a disease when you have no symptoms.  A diet and exercise plan that is right for you. What should I know about screenings and tests to prevent falls? Screening and testing are the best ways to find a health problem early. Early diagnosis and treatment give you the best chance of managing medical conditions that are common after age 69. Certain conditions and lifestyle choices may make you more likely to have a fall. Your health care provider may recommend:  Regular vision checks. Poor vision and conditions such as cataracts can make you more likely to have a fall. If you wear glasses, make sure to get your prescription updated if your vision changes.  Medicine review. Work with your health care provider to regularly review all of the medicines you are taking, including over-the-counter medicines. Ask your health care provider about any side effects that may make you more likely to have a fall. Tell your health care provider if any medicines that you take make you feel dizzy or sleepy.  Osteoporosis screening. Osteoporosis is a condition that causes the bones to get weaker. This can make the bones weak and cause them to break more easily.  Blood pressure screening. Blood pressure changes and medicines to control blood pressure can make you feel dizzy.  Strength and balance checks. Your health care provider may recommend certain tests to check your  strength and balance while standing, walking, or changing positions.  Foot health exam. Foot pain and numbness, as well as not wearing proper footwear, can make you more likely to have a fall.  Depression screening. You may be more likely to have a fall if you have a fear of falling, feel emotionally low, or feel unable to do activities that you used to do.  Alcohol use screening. Using too much alcohol can affect your balance and may make you more likely to have a fall. What actions can I take to lower my risk of falls? General instructions  Talk with your health care provider about your risks for falling. Tell your health care provider if: ? You fall. Be sure to tell your health care provider about all falls, even ones that seem minor. ? You feel dizzy, sleepy, or off-balance.  Take over-the-counter and prescription medicines only as told by your health care provider. These include any supplements.  Eat a healthy diet and maintain a healthy weight. A healthy diet includes low-fat dairy products, low-fat (lean) meats, and fiber from whole grains, beans, and lots of fruits and vegetables. Home safety  Remove any tripping hazards, such as rugs, cords, and clutter.  Install safety equipment such as grab bars in bathrooms and safety rails on stairs.  Keep rooms and walkways well-lit. Activity  Follow a regular exercise program to stay fit. This will help you maintain your balance. Ask your health care provider what types of exercise are appropriate for you.  If you need a cane or walker,   use it as recommended by your health care provider.  Wear supportive shoes that have nonskid soles.   Lifestyle  Do not drink alcohol if your health care provider tells you not to drink.  If you drink alcohol, limit how much you have: ? 0-1 drink a day for women. ? 0-2 drinks a day for men.  Be aware of how much alcohol is in your drink. In the [place] one drink equals one typical bottle of beer (12  oz), one-half glass of wine (5 oz), or one shot of hard liquor (1 oz).  Do not use any products that contain nicotine or tobacco, such as cigarettes and e-cigarettes. If you need help quitting, ask your health care provider. Summary  Having a healthy lifestyle and getting preventive care can help to protect your health and wellness after age 69.  Screening and testing are the best way to find a health problem early and help you avoid having a fall. Early diagnosis and treatment give you the best chance for managing medical conditions that are more common for people who are older than age 69.  Falls are a major cause of broken bones and head injuries in people who are older than age 69. Take precautions to prevent a fall at home.  Work with your health care provider to learn what changes you can make to improve your health and wellness and to prevent falls. This information is not intended to replace advice given to you by your health care provider. Make sure you discuss any questions you have with your health care provider. Document Revised: 06/28/2018 Document Reviewed: 01/18/2017 Elsevier Patient Education  2021 Elsevier Inc.  

## 2020-07-23 NOTE — Assessment & Plan Note (Signed)
Continue CPAP Discussed how weight loss can help reduce OSA symptoms
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
## 2020-07-23 NOTE — Assessment & Plan Note (Signed)
Deteriorated D/C HCTZ Will start Dyazide 37.5-25 mg PO daily Reinforced DASH diet and exercise for weight loss CMET today

## 2020-07-23 NOTE — Assessment & Plan Note (Signed)
Well controlled on Humira, Celebrex and Fish Oil He will continue to follow with rheumatology, will follow

## 2020-07-23 NOTE — Assessment & Plan Note (Signed)
Intermittent Avoid foods that trigger your reflux CBC and CMET today Ok to take Tums/Rolaids or Maalox/Mylantat as needed

## 2020-07-23 NOTE — Assessment & Plan Note (Signed)
Already taking Benefiber Consider Mirilax every other day

## 2020-08-04 ENCOUNTER — Other Ambulatory Visit: Payer: Self-pay | Admitting: Neurosurgery

## 2020-08-04 ENCOUNTER — Other Ambulatory Visit (HOSPITAL_COMMUNITY): Payer: Self-pay | Admitting: Neurosurgery

## 2020-08-04 DIAGNOSIS — Z6833 Body mass index (BMI) 33.0-33.9, adult: Secondary | ICD-10-CM | POA: Diagnosis not present

## 2020-08-04 DIAGNOSIS — R030 Elevated blood-pressure reading, without diagnosis of hypertension: Secondary | ICD-10-CM | POA: Diagnosis not present

## 2020-08-04 DIAGNOSIS — M5126 Other intervertebral disc displacement, lumbar region: Secondary | ICD-10-CM | POA: Diagnosis not present

## 2020-08-07 ENCOUNTER — Other Ambulatory Visit: Payer: Medicare Other

## 2020-08-07 ENCOUNTER — Other Ambulatory Visit: Payer: Self-pay

## 2020-08-07 DIAGNOSIS — E6609 Other obesity due to excess calories: Secondary | ICD-10-CM | POA: Diagnosis not present

## 2020-08-07 DIAGNOSIS — I10 Essential (primary) hypertension: Secondary | ICD-10-CM

## 2020-08-07 DIAGNOSIS — Z6833 Body mass index (BMI) 33.0-33.9, adult: Secondary | ICD-10-CM | POA: Diagnosis not present

## 2020-08-07 LAB — COMPREHENSIVE METABOLIC PANEL
AG Ratio: 1.9 (calc) (ref 1.0–2.5)
ALT: 17 U/L (ref 9–46)
AST: 19 U/L (ref 10–35)
Albumin: 4.2 g/dL (ref 3.6–5.1)
Alkaline phosphatase (APISO): 48 U/L (ref 35–144)
BUN: 22 mg/dL (ref 7–25)
CO2: 31 mmol/L (ref 20–32)
Calcium: 9.5 mg/dL (ref 8.6–10.3)
Chloride: 99 mmol/L (ref 98–110)
Creat: 1.18 mg/dL (ref 0.70–1.25)
Globulin: 2.2 g/dL (calc) (ref 1.9–3.7)
Glucose, Bld: 111 mg/dL — ABNORMAL HIGH (ref 65–99)
Potassium: 3.8 mmol/L (ref 3.5–5.3)
Sodium: 137 mmol/L (ref 135–146)
Total Bilirubin: 0.7 mg/dL (ref 0.2–1.2)
Total Protein: 6.4 g/dL (ref 6.1–8.1)

## 2020-08-07 LAB — CBC
HCT: 50.9 % — ABNORMAL HIGH (ref 38.5–50.0)
Hemoglobin: 17.2 g/dL — ABNORMAL HIGH (ref 13.2–17.1)
MCH: 32.5 pg (ref 27.0–33.0)
MCHC: 33.8 g/dL (ref 32.0–36.0)
MCV: 96 fL (ref 80.0–100.0)
MPV: 11 fL (ref 7.5–12.5)
Platelets: 206 10*3/uL (ref 140–400)
RBC: 5.3 10*6/uL (ref 4.20–5.80)
RDW: 12.3 % (ref 11.0–15.0)
WBC: 7.1 10*3/uL (ref 3.8–10.8)

## 2020-08-07 LAB — LIPID PANEL
Cholesterol: 196 mg/dL (ref <200)
HDL: 46 mg/dL (ref >=40)
LDL Cholesterol (Calc): 126 mg/dL (calc) — ABNORMAL HIGH
Non-HDL Cholesterol (Calc): 150 mg/dL (calc) — ABNORMAL HIGH (ref <130)
Total CHOL/HDL Ratio: 4.3 (calc) (ref <5.0)
Triglycerides: 126 mg/dL (ref <150)

## 2020-08-12 ENCOUNTER — Ambulatory Visit
Admission: RE | Admit: 2020-08-12 | Discharge: 2020-08-12 | Disposition: A | Discharge status: Home / Self Care | Payer: Medicare Other | Source: Ambulatory Visit | Attending: Neurosurgery | Admitting: Neurosurgery

## 2020-08-12 ENCOUNTER — Other Ambulatory Visit: Payer: Self-pay

## 2020-08-12 DIAGNOSIS — M5126 Other intervertebral disc displacement, lumbar region: Secondary | ICD-10-CM | POA: Insufficient documentation

## 2020-08-12 DIAGNOSIS — M545 Low back pain, unspecified: Secondary | ICD-10-CM | POA: Diagnosis not present

## 2020-08-18 DIAGNOSIS — Z1211 Encounter for screening for malignant neoplasm of colon: Secondary | ICD-10-CM | POA: Diagnosis not present

## 2020-08-18 LAB — COLOGUARD: Cologuard: NEGATIVE

## 2020-08-23 LAB — COLOGUARD: COLOGUARD: NEGATIVE

## 2020-08-26 ENCOUNTER — Other Ambulatory Visit: Payer: Self-pay

## 2020-09-07 DIAGNOSIS — M7062 Trochanteric bursitis, left hip: Secondary | ICD-10-CM | POA: Diagnosis not present

## 2020-09-07 DIAGNOSIS — M545 Low back pain, unspecified: Secondary | ICD-10-CM | POA: Diagnosis not present

## 2020-09-07 DIAGNOSIS — L4050 Arthropathic psoriasis, unspecified: Secondary | ICD-10-CM | POA: Diagnosis not present

## 2020-09-08 DIAGNOSIS — Z6834 Body mass index (BMI) 34.0-34.9, adult: Secondary | ICD-10-CM | POA: Diagnosis not present

## 2020-09-08 DIAGNOSIS — M4319 Spondylolisthesis, multiple sites in spine: Secondary | ICD-10-CM | POA: Diagnosis not present

## 2020-09-08 DIAGNOSIS — R030 Elevated blood-pressure reading, without diagnosis of hypertension: Secondary | ICD-10-CM | POA: Diagnosis not present

## 2020-11-17 DIAGNOSIS — L400 Psoriasis vulgaris: Secondary | ICD-10-CM | POA: Diagnosis not present

## 2020-11-17 DIAGNOSIS — D485 Neoplasm of uncertain behavior of skin: Secondary | ICD-10-CM | POA: Diagnosis not present

## 2020-11-17 DIAGNOSIS — D225 Melanocytic nevi of trunk: Secondary | ICD-10-CM | POA: Diagnosis not present

## 2020-11-17 DIAGNOSIS — L570 Actinic keratosis: Secondary | ICD-10-CM | POA: Diagnosis not present

## 2020-11-17 DIAGNOSIS — L853 Xerosis cutis: Secondary | ICD-10-CM | POA: Diagnosis not present

## 2020-11-17 DIAGNOSIS — L4059 Other psoriatic arthropathy: Secondary | ICD-10-CM | POA: Diagnosis not present

## 2020-11-17 DIAGNOSIS — X32XXXA Exposure to sunlight, initial encounter: Secondary | ICD-10-CM | POA: Diagnosis not present

## 2020-11-17 DIAGNOSIS — D2262 Melanocytic nevi of left upper limb, including shoulder: Secondary | ICD-10-CM | POA: Diagnosis not present

## 2021-03-05 DIAGNOSIS — Z20822 Contact with and (suspected) exposure to covid-19: Secondary | ICD-10-CM | POA: Diagnosis not present

## 2021-04-28 ENCOUNTER — Other Ambulatory Visit: Payer: Self-pay

## 2021-04-28 ENCOUNTER — Encounter: Payer: Self-pay | Admitting: Internal Medicine

## 2021-04-28 ENCOUNTER — Ambulatory Visit (INDEPENDENT_AMBULATORY_CARE_PROVIDER_SITE_OTHER): Payer: Medicare Other | Admitting: Internal Medicine

## 2021-04-28 VITALS — BP 118/80 | HR 58 | Temp 97.3°F | Ht 69.0 in | Wt 232.4 lb

## 2021-04-28 DIAGNOSIS — G4733 Obstructive sleep apnea (adult) (pediatric): Secondary | ICD-10-CM

## 2021-04-28 NOTE — Progress Notes (Signed)
[facility] Consultation       Date: 04/28/2021  MRN# [id number] Samuel Crawford July 19, 1950   The patient is 71 year old male, with a history of obstructive sleep apnea. He continues to do well CPAP, he is using it every night, he is more awake during the day.  He remains more awake during the day, he is not snoring. He is getting new supplies every 3 months and cleans them every week.   **Download dated 03/06/2018-04/04/2018>> raw data personally reviewed, usage greater than 4 hours is 30/30 days.  Average usage on days used is 7 hours 37 minutes, pressure range 5-20.  Leaks are slightly elevated but within normal limits.  Pressure median 7, 95th percentile pressure 10, maximum pressure 12.  Residual AHI is 2.6.  Overall this shows excellent control of obstructive sleep apnea with excellent compliance with CPAP. Download data review, 30 days as of 04/09/17.  Usage is 30/30 days.  Average usage on days used is 7 hours 30 minutes.  Setting is auto, 5-20.  Median pressure is 8.1, 95th percentile pressure is 11.6, maximum pressure is 13.  Residual AHI is 3.8.  Review of outside sleep studies; -Home sleep study 12/12/16; AHI of 24. -Baseline sleep study. 05/27/2005, AHI was 40, increased to 52 when supine. -CPAP titration 08/06/2005; CPAP titrated to a pressure level of 8. Overall these tests show severe affective sleep apnea, adequately treated, at a CPAP level of 8     CC Follow-up sleep apnea  HPI Patient with a previous history of severe OSA greater than 52 Doing well with therapy No acute issues Compliance report is excellent with 100% days and 100% greater than 4 hours AHI reduced to 7.3 Auto CPAP 5-15 Compliance report reviewed in detail with patient  No exacerbation at this time No evidence of heart failure at this time No evidence or signs of infection at this time No respiratory distress No fevers, chills, nausea, vomiting, diarrhea No evidence of lower  extremity edema No evidence hemoptysis      Medication:    Current Outpatient Medications:    Adalimumab (HUMIRA) 40 MG/0.4ML PSKT, Inject into the skin every 14 (fourteen) days., Disp: , Rfl:    celecoxib (CELEBREX) 100 MG capsule, Take 100 mg by mouth 2 (two) times daily as needed., Disp: , Rfl:    hydrocortisone cream 1 %, Apply 1 application topically 2 (two) times daily as needed for itching (psoriasis)., Disp: , Rfl:    loratadine (CLARITIN) 10 MG tablet, Take 10 mg by mouth at bedtime. , Disp: , Rfl:    Multiple Vitamin (MULTIVITAMIN) tablet, Take 1 tablet by mouth daily., Disp: , Rfl:    Omega-3 Fatty Acids (FISH OIL) 1200 MG CPDR, Take 1,200 mg by mouth daily., Disp: , Rfl:    triamterene-hydrochlorothiazide (DYAZIDE) 37.5-25 MG capsule, Take 1 each (1 capsule total) by mouth daily., Disp: 90 capsule, Rfl: 3   Wheat Dextrin (BENEFIBER PO), Take by mouth., Disp: , Rfl:    Allergies:  Duloxetine hcl, Shellfish allergy, Amoxicillin, Betadine [povidone iodine], and Other   Review of Systems:  Gen:  Denies  fever, sweats, chills weight loss  HEENT: Denies blurred vision, double vision, ear pain, eye pain, hearing loss, nose bleeds, sore throat Cardiac:  No dizziness, chest pain or heaviness, chest tightness,edema, No JVD Resp:   No cough, -sputum production, -shortness of breath,-wheezing, -hemoptysis,  Other:  All other systems negative   BP 118/80 (BP Location: Left Arm, Patient Position: Sitting, Cuff Size:  Normal)    Pulse (!) 58    Temp (!) 97.3 F (36.3 C) (Oral)    Ht 5\' 9"  (1.753 m)    Wt 232 lb 6.4 oz (105.4 kg)    SpO2 95%    BMI 34.32 kg/m   Physical Examination:   General Appearance: No distress  EYES PERRLA, EOM intact.   NECK Supple, No JVD Pulmonary: normal breath sounds, No wheezing.  CardiovascularNormal S1,S2.  No m/r/g.   ALL OTHER ROS ARE NEGATIVE    Assessment and Plan:  Severe obstructive sleep apnea. CPAP 5-15 cm h20 Patient uses and  benefits from therapy Patient with excellent Compliance report 100% days and >4 hrs  AHI reduced down to 7.3    Ho Pulmonary embolism. PROVOKED Patient developed a DVT from PICC line, then developed PE. Is now off of anticoagulation.     MEDICATION ADJUSTMENTS/LABS AND TESTS ORDERED: Continue CPAP as prescribed   CURRENT MEDICATIONS REVIEWED AT LENGTH WITH PATIENT TODAY  Follow-up in 1 year Total time spent 15 minutes   Patient satisfied with Plan of action and management. All questions answered    Corrin Parker, M.D.  [facility]  Medical Director [place] Director [facility] Cardio-Pulmonary Department

## 2021-04-28 NOTE — Patient Instructions (Addendum)
Continue CPAP as prescribed  KEEP UP  THE GREAT WORK!! A+

## 2021-05-26 ENCOUNTER — Other Ambulatory Visit: Payer: Self-pay | Admitting: Internal Medicine

## 2021-05-26 NOTE — Telephone Encounter (Signed)
Ordered 07/23/2020 #90 3 refills - 1 year supply. ?Requested Prescriptions  ?Pending Prescriptions Disp Refills  ?? triamterene-hydrochlorothiazide (DYAZIDE) 37.5-25 MG capsule [Pharmacy Med Name: TRIAMTERENE/HCTZ CAPS 37.5/'25MG'$ ] 90 capsule 3  ?  Sig: TAKE 1 CAPSULE DAILY  ?  ? Cardiovascular: Diuretic Combos Failed - 05/26/2021  2:21 AM  ?  ?  Failed - K in normal range and within 180 days  ?  Potassium  ?Date Value Ref Range Status  ?08/07/2020 3.8 3.5 - 5.3 mmol/L Final  ?   ?  ?  Failed - Na in normal range and within 180 days  ?  Sodium  ?Date Value Ref Range Status  ?08/07/2020 137 135 - 146 mmol/L Final  ?10/14/2015 142 134 - 144 mmol/L Final  ?   ?  ?  Failed - Cr in normal range and within 180 days  ?  Creat  ?Date Value Ref Range Status  ?08/07/2020 1.18 0.70 - 1.25 mg/dL Final  ?  Comment:  ?  For patients >34 years of age, the reference limit ?for Creatinine is approximately 13% higher for people ?identified as African-American. ?. ?  ?   ?  ?  Failed - Valid encounter within last 6 months  ?  Recent Outpatient Visits   ?None ?  ?  ? ?  ?  ?  Passed - Last BP in normal range  ?  BP Readings from Last 1 Encounters:  ?04/28/21 118/80  ?   ?  ?  ? ?

## 2021-05-28 ENCOUNTER — Emergency Department: Payer: No Typology Code available for payment source

## 2021-05-28 ENCOUNTER — Emergency Department
Admission: EM | Admit: 2021-05-28 | Discharge: 2021-05-28 | Disposition: A | Discharge status: Home / Self Care | Payer: No Typology Code available for payment source | Attending: Emergency Medicine | Admitting: Emergency Medicine

## 2021-05-28 ENCOUNTER — Other Ambulatory Visit: Payer: Self-pay

## 2021-05-28 DIAGNOSIS — S6992XA Unspecified injury of left wrist, hand and finger(s), initial encounter: Secondary | ICD-10-CM | POA: Diagnosis present

## 2021-05-28 DIAGNOSIS — I10 Essential (primary) hypertension: Secondary | ICD-10-CM | POA: Diagnosis not present

## 2021-05-28 DIAGNOSIS — R58 Hemorrhage, not elsewhere classified: Secondary | ICD-10-CM | POA: Diagnosis not present

## 2021-05-28 DIAGNOSIS — W5501XA Bitten by cat, initial encounter: Secondary | ICD-10-CM | POA: Diagnosis not present

## 2021-05-28 DIAGNOSIS — S61412A Laceration without foreign body of left hand, initial encounter: Secondary | ICD-10-CM

## 2021-05-28 DIAGNOSIS — M25531 Pain in right wrist: Secondary | ICD-10-CM

## 2021-05-28 DIAGNOSIS — R5381 Other malaise: Secondary | ICD-10-CM | POA: Diagnosis not present

## 2021-05-28 DIAGNOSIS — W5581XA Bitten by other mammals, initial encounter: Secondary | ICD-10-CM | POA: Diagnosis not present

## 2021-05-28 MED ORDER — DOXYCYCLINE HYCLATE 100 MG PO CAPS: 100.0000 mg | ORAL_CAPSULE | Freq: Two times a day (BID) | ORAL | 0 refills | Status: AC

## 2021-05-28 MED ORDER — METRONIDAZOLE 500 MG PO TABS: 500.0000 mg | ORAL_TABLET | Freq: Three times a day (TID) | ORAL | 0 refills | Status: DC

## 2021-05-28 MED ORDER — LIDOCAINE HCL (PF) 1 % IJ SOLN
5.0000 mL | Freq: Once | INTRAMUSCULAR | Status: AC
  Administered 2021-05-28: 5 mL via INTRADERMAL
  Filled 2021-05-28: qty 5

## 2021-05-28 NOTE — ED Notes (Signed)
Lidocaine at bedside.

## 2021-05-28 NOTE — ED Triage Notes (Signed)
Pt was attempting to put cat in carrier for vet visit and received multiple bites/scratches to bilateral wrists.  Unsure if it was a claw or a tooth that seems to have knicked radial artery on right right.  Bleeding controlled with pressure at this time.  ?

## 2021-05-28 NOTE — ED Provider Notes (Signed)
? ?[facility] ?Provider Note ? ? ? Event Date/Time  ? First MD Initiated Contact with Patient 05/28/21 [phone]   ?  (approximate) ? ? ?History  ? ?Animal Bite and Arterial Injury ? ? ?HPI ? ?Samuel Crawford is a 71 y.o. male with a history of hypertension GERD psoriatic arthritis on Enbrel who is brought to the ED due to cat bite and scratches causing wounds on bilateral upper extremities. ? ?Patient reports his cat is well cared for, usual state of health, and today while he was trying to put it in a carrier to take it to the vet, the cat resisted, but his right distal forearm and scratched his other arm.  He is left-hand dominant.  Initially there was brisk pulsatile bleeding from the right wrist area.  He has some pain at that area in the area of scratches but no distal right hand pain coldness or paresthesia. ? ?He reports tetanus is up-to-date within the last 5 years. ?  ? ? ?Physical Exam  ? ?Triage Vital Signs: ?ED Triage Vitals  ?Enc Vitals Group  ?   BP 05/28/21 0825 135/81  ?   Pulse Rate 05/28/21 0825 (!) 56  ?   Resp 05/28/21 0825 18  ?   Temp 05/28/21 0825 98 ?F (36.7 ?C)  ?   Temp Source 05/28/21 0825 Oral  ?   SpO2 05/28/21 0825 96 %  ?   Weight --   ?   Height --   ?   Head Circumference --   ?   Peak Flow --   ?   Pain Score 05/28/21 0831 4  ?   Pain Loc --   ?   Pain Edu? --   ?   Excl. in [place]? --   ? ? ?Most recent vital signs: ?Vitals:  ? 05/28/21 0825  ?BP: 135/81  ?Pulse: (!) 56  ?Resp: 18  ?Temp: 98 ?F (36.7 ?C)  ?SpO2: 96%  ? ? ? ?General: Awake, no distress.  ?CV:  Good peripheral perfusion.  Normal capillary refill in all fingers. ?Resp:  Normal effort.  ?Abd:  No distention.  ?Other:  There is a 3 mm wound overlying the right distal radial artery.  Normal arterial pulse.  No hematoma or pulsatile mass or swelling in the area.  No ecchymosis.  No pallor coldness or paresthesia of the right hand. ? ?There is a 1.5 cm laceration overlying the dorsal left hand through the  skin into subcutaneous tissue.  Extensor tendon function is intact.  There are multiple scratches overlying the left distal forearm and hand as well, hemostatic. ? ? ?ED Results / Procedures / Treatments  ? ?Labs ?(all labs ordered are listed, but only abnormal results are displayed) ?Labs Reviewed - No data to display ? ? ?EKG ? ? ? ? ?RADIOLOGY ?X-ray right wrist viewed and interpreted by me, shows no fracture or retained foreign body.  Radiology report reviewed ? ? ? ?PROCEDURES: ? ?Critical Care performed: No ? ?Marland Kitchen.Laceration Repair ? ?Date/Time: 05/28/2021 8:56 AM ?Performed by: Carrie Mew, MD ?Authorized by: Carrie Mew, MD  ? ?Consent:  ?  Consent obtained:  Verbal ?  Consent given by:  Patient ?  Risks, benefits, and alternatives were discussed: yes   ?  Risks discussed:  Infection, need for additional repair, poor wound healing, pain and retained foreign body ?  Alternatives discussed:  No treatment ?Universal protocol:  ?  Patient identity confirmed:  Verbally with patient ?Anesthesia:  ?  Anesthesia method:  Local infiltration ?  Local anesthetic:  Lidocaine 1% w/o epi ?Laceration details:  ?  Location:  Hand ?  Hand location:  L hand, dorsum ?  Length (cm):  1.5 ?Pre-procedure details:  ?  Preparation:  Patient was prepped and draped in usual sterile fashion ?Exploration:  ?  Hemostasis achieved with:  Direct pressure ?  Wound exploration: wound explored through full range of motion and entire depth of wound visualized   ?  Wound extent: no fascia violation noted, no foreign bodies/material noted, no muscle damage noted, no nerve damage noted, no tendon damage noted, no underlying fracture noted and no vascular damage noted   ?Treatment:  ?  Area cleansed with:  Saline and chlorhexidine (peroxide) ?  Irrigation solution:  Sterile saline ?  Irrigation method:  Pressure wash ?  Visualized foreign bodies/material removed: no   ?  Debridement:  Minimal ?  Undermining:  None ?  Scar revision: no    ?Skin repair:  ?  Repair method:  Sutures ?  Suture size:  4-0 ?  Wound skin closure material used: monocryl. ?  Suture technique:  Horizontal mattress ?  Number of sutures:  2 ?Approximation:  ?  Approximation:  Loose ?Repair type:  ?  Repair type:  Intermediate ?Post-procedure details:  ?  Dressing:  Sterile dressing ?  Procedure completion:  Tolerated well, no immediate complications ? ? ?MEDICATIONS ORDERED IN ED: ?Medications  ?lidocaine (PF) (XYLOCAINE) 1 % injection 5 mL (has no administration in time range)  ? ? ? ?IMPRESSION / MDM / ASSESSMENT AND PLAN / ED COURSE  ?I reviewed the triage vital signs and the nursing notes. ?             ?               ? ?Patient presents after animal bite, provoked, up-to-date on vaccinations. ? ?Wounds cleaned.  Left dorsal hand laceration repaired.  No retained foreign body.  He reports an allergy to amoxicillin, will treat with Doxy and Flagyl, full course given his possible immunosuppression from Enbrel. ? ? ? ? ?  ? ? ?FINAL CLINICAL IMPRESSION(S) / ED DIAGNOSES  ? ?Final diagnoses:  ?Wrist pain, acute, right  ?Cat bite, initial encounter  ?Laceration of left hand, initial encounter  ? ? ? ?Rx / DC Orders  ? ?ED Discharge Orders   ? ?      Ordered  ?  doxycycline (VIBRAMYCIN) 100 MG capsule  2 times daily       ? 05/28/21 0949  ?  metroNIDAZOLE (FLAGYL) 500 MG tablet  3 times daily       ? 05/28/21 0949  ? ?  ?  ? ?  ? ? ? ?Note:  This document was prepared using Dragon voice recognition software and may include unintentional dictation errors. ?  ?Carrie Mew, MD ?05/28/21 [phone] ? ?

## 2021-05-28 NOTE — ED Notes (Signed)
Pt's wounds cleansed well with sterile water and chlorhexadine.  No additional bleeding noted.  ?

## 2021-06-28 ENCOUNTER — Encounter: Payer: Self-pay | Admitting: Internal Medicine

## 2021-06-28 MED ORDER — TRIAMTERENE-HCTZ 37.5-25 MG PO CAPS
1.0000 | ORAL_CAPSULE | Freq: Every day | ORAL | 0 refills | Status: DC
Start: 2021-06-28 — End: 2021-08-26

## 2021-07-12 ENCOUNTER — Encounter: Payer: Self-pay | Admitting: Internal Medicine

## 2021-08-26 ENCOUNTER — Ambulatory Visit (INDEPENDENT_AMBULATORY_CARE_PROVIDER_SITE_OTHER): Payer: Medicare Other | Admitting: Internal Medicine

## 2021-08-26 ENCOUNTER — Encounter: Payer: Self-pay | Admitting: Internal Medicine

## 2021-08-26 VITALS — BP 156/88 | HR 62 | Temp 96.9°F | Ht 69.0 in | Wt 237.0 lb

## 2021-08-26 DIAGNOSIS — I158 Other secondary hypertension: Secondary | ICD-10-CM

## 2021-08-26 DIAGNOSIS — Z125 Encounter for screening for malignant neoplasm of prostate: Secondary | ICD-10-CM | POA: Diagnosis not present

## 2021-08-26 DIAGNOSIS — E7800 Pure hypercholesterolemia, unspecified: Secondary | ICD-10-CM | POA: Diagnosis not present

## 2021-08-26 DIAGNOSIS — R7303 Prediabetes: Secondary | ICD-10-CM

## 2021-08-26 DIAGNOSIS — Z6835 Body mass index (BMI) 35.0-35.9, adult: Secondary | ICD-10-CM

## 2021-08-26 DIAGNOSIS — R3915 Urgency of urination: Secondary | ICD-10-CM | POA: Diagnosis not present

## 2021-08-26 DIAGNOSIS — L4050 Arthropathic psoriasis, unspecified: Secondary | ICD-10-CM | POA: Diagnosis not present

## 2021-08-26 DIAGNOSIS — I700 Atherosclerosis of aorta: Secondary | ICD-10-CM

## 2021-08-26 DIAGNOSIS — E6609 Other obesity due to excess calories: Secondary | ICD-10-CM | POA: Insufficient documentation

## 2021-08-26 DIAGNOSIS — Z9989 Dependence on other enabling machines and devices: Secondary | ICD-10-CM

## 2021-08-26 DIAGNOSIS — D751 Secondary polycythemia: Secondary | ICD-10-CM | POA: Insufficient documentation

## 2021-08-26 DIAGNOSIS — E119 Type 2 diabetes mellitus without complications: Secondary | ICD-10-CM | POA: Insufficient documentation

## 2021-08-26 DIAGNOSIS — G4733 Obstructive sleep apnea (adult) (pediatric): Secondary | ICD-10-CM

## 2021-08-26 DIAGNOSIS — E66811 Obesity, class 1: Secondary | ICD-10-CM | POA: Insufficient documentation

## 2021-08-26 DIAGNOSIS — K219 Gastro-esophageal reflux disease without esophagitis: Secondary | ICD-10-CM

## 2021-08-26 MED ORDER — ASPIRIN 81 MG PO TBEC: 81.0000 mg | DELAYED_RELEASE_TABLET | Freq: Every day | ORAL | 12 refills | Status: AC

## 2021-08-26 MED ORDER — TRIAMTERENE-HCTZ 37.5-25 MG PO CAPS: 1.0000 | ORAL_CAPSULE | Freq: Every day | ORAL | 1 refills | Status: DC

## 2021-08-26 NOTE — Assessment & Plan Note (Signed)
Elevated today but he did not take his blood pressure medicine this morning Continue Triamterene HCT Reinforced DASH diet and exercise for weight loss Asked him to come in in 2 weeks for nurse visit BP check however he prefers to just check his blood pressure at home and notify me via [place]

## 2021-08-26 NOTE — Assessment & Plan Note (Signed)
Encourage weight loss as this can help her to sleep apnea symptoms Continue CPAP

## 2021-08-26 NOTE — Assessment & Plan Note (Signed)
Managed with Humira, Celebrex and Folic Acid He will continue to follow with rheumatology

## 2021-08-26 NOTE — Assessment & Plan Note (Signed)
C-Met and lipid profile today Encouraged him to consume a low-fat diet Continue Fish Oil, will consider statin therapy if LDL >100

## 2021-08-26 NOTE — Assessment & Plan Note (Signed)
Encourage diet and exercise for weight loss 

## 2021-08-26 NOTE — Assessment & Plan Note (Signed)
Encourage weight loss as this can help reduce reflux symptoms Okay to take Tums or Rolaids OTC as needed

## 2021-08-26 NOTE — Assessment & Plan Note (Signed)
A1c today Encourage low-carb diet and exercise for weight loss 

## 2021-08-26 NOTE — Assessment & Plan Note (Signed)
C-Met and lipid profile today Encouraged him to consume a low-fat diet Continue Fish Oil, will discuss statin therapy if LDL >100 Start baby Aspirin daily

## 2021-08-26 NOTE — Patient Instructions (Signed)
Heart-Healthy Eating Plan Heart-healthy meal planning includes: Eating less unhealthy fats. Eating more healthy fats. Making other changes in your diet. Talk with your doctor or a diet specialist (dietitian) to create an eating plan that is right for you. What is my plan? Your doctor may recommend an eating plan that includes: Total fat: ______% or less of total calories a day. Saturated fat: ______% or less of total calories a day. Cholesterol: less than _________mg a day. What are tips for following this plan? Cooking Avoid frying your food. Try to bake, boil, grill, or broil it instead. You can also reduce fat by: Removing the skin from poultry. Removing all visible fats from meats. Steaming vegetables in water or broth. Meal planning  At meals, divide your plate into four equal parts: Fill one-half of your plate with vegetables and green salads. Fill one-fourth of your plate with whole grains. Fill one-fourth of your plate with lean protein foods. Eat 4-5 servings of vegetables per day. A serving of vegetables is: 1 cup of raw or cooked vegetables. 2 cups of raw leafy greens. Eat 4-5 servings of fruit per day. A serving of fruit is: 1 medium whole fruit.  cup of dried fruit.  cup of fresh, frozen, or canned fruit.  cup of 100% fruit juice. Eat more foods that have soluble fiber. These are apples, broccoli, carrots, beans, peas, and barley. Try to get 20-30 g of fiber per day. Eat 4-5 servings of nuts, legumes, and seeds per week: 1 serving of dried beans or legumes equals  cup after being cooked. 1 serving of nuts is  cup. 1 serving of seeds equals 1 tablespoon. General information Eat more home-cooked food. Eat less restaurant, buffet, and fast food. Limit or avoid alcohol. Limit foods that are high in starch and sugar. Avoid fried foods. Lose weight if you are overweight. Keep track of how much salt (sodium) you eat. This is important if you have high blood  pressure. Ask your doctor to tell you more about this. Try to add vegetarian meals each week. Fats Choose healthy fats. These include olive oil and canola oil, flaxseeds, walnuts, almonds, and seeds. Eat more omega-3 fats. These include salmon, mackerel, sardines, tuna, flaxseed oil, and ground flaxseeds. Try to eat fish at least 2 times each week. Check food labels. Avoid foods with trans fats or high amounts of saturated fat. Limit saturated fats. These are often found in animal products, such as meats, butter, and cream. These are also found in plant foods, such as palm oil, palm kernel oil, and coconut oil. Avoid foods with partially hydrogenated oils in them. These have trans fats. Examples are stick margarine, some tub margarines, cookies, crackers, and other baked goods. What foods can I eat? Fruits All fresh, canned (in natural juice), or frozen fruits. Vegetables Fresh or frozen vegetables (raw, steamed, roasted, or grilled). Green salads. Grains Most grains. Choose whole wheat and whole grains most of the time. Rice and pasta, including brown rice and pastas made with whole wheat. Meats and other proteins Lean, well-trimmed beef, veal, pork, and lamb. Chicken and turkey without skin. All fish and shellfish. Wild duck, rabbit, pheasant, and venison. Egg whites or low-cholesterol egg substitutes. Dried beans, peas, lentils, and tofu. Seeds and most nuts. Dairy Low-fat or nonfat cheeses, including ricotta and mozzarella. Skim or 1% milk that is liquid, powdered, or evaporated. Buttermilk that is made with low-fat milk. Nonfat or low-fat yogurt. Fats and oils Non-hydrogenated (trans-free) margarines. Vegetable oils, including   soybean, sesame, sunflower, olive, peanut, safflower, corn, canola, and cottonseed. Salad dressings or mayonnaise made with a vegetable oil. Beverages Mineral water. Coffee and tea. Diet carbonated beverages. Sweets and desserts Sherbet, gelatin, and fruit ice.  Small amounts of dark chocolate. Limit all sweets and desserts. Seasonings and condiments All seasonings and condiments. The items listed above may not be a complete list of foods and drinks you can eat. Contact a dietitian for more options. What foods should I avoid? Fruits Canned fruit in heavy syrup. Fruit in cream or butter sauce. Fried fruit. Limit coconut. Vegetables Vegetables cooked in cheese, cream, or butter sauce. Fried vegetables. Grains Breads that are made with saturated or trans fats, oils, or whole milk. Croissants. Sweet rolls. Donuts. High-fat crackers, such as cheese crackers. Meats and other proteins Fatty meats, such as hot dogs, ribs, sausage, bacon, rib-eye roast or steak. High-fat deli meats, such as salami and bologna. Caviar. Domestic duck and goose. Organ meats, such as liver. Dairy Cream, sour cream, cream cheese, and creamed cottage cheese. Whole-milk cheeses. Whole or 2% milk that is liquid, evaporated, or condensed. Whole buttermilk. Cream sauce or high-fat cheese sauce. Yogurt that is made from whole milk. Fats and oils Meat fat, or shortening. Cocoa butter, hydrogenated oils, palm oil, coconut oil, palm kernel oil. Solid fats and shortenings, including bacon fat, salt pork, lard, and butter. Nondairy cream substitutes. Salad dressings with cheese or sour cream. Beverages Regular sodas and juice drinks with added sugar. Sweets and desserts Frosting. Pudding. Cookies. Cakes. Pies. Milk chocolate or white chocolate. Buttered syrups. Full-fat ice cream or ice cream drinks. The items listed above may not be a complete list of foods and drinks to avoid. Contact a dietitian for more information. Summary Heart-healthy meal planning includes eating less unhealthy fats, eating more healthy fats, and making other changes in your diet. Eat a balanced diet. This includes fruits and vegetables, low-fat or nonfat dairy, lean protein, nuts and legumes, whole grains, and  heart-healthy oils and fats. This information is not intended to replace advice given to you by your health care provider. Make sure you discuss any questions you have with your health care provider. Document Revised: 07/16/2020 Document Reviewed: 07/16/2020 Elsevier Patient Education  2022 Elsevier Inc.  

## 2021-08-26 NOTE — Assessment & Plan Note (Signed)
CBC with differential today We will have him start baby Aspirin
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
## 2021-08-27 LAB — CBC WITH DIFFERENTIAL/PLATELET
Absolute Monocytes: 604 cells/uL (ref 200–950)
Basophils Absolute: 43 cells/uL (ref 0–200)
Basophils Relative: 0.7 %
Eosinophils Absolute: 128 cells/uL (ref 15–500)
Eosinophils Relative: 2.1 %
HCT: 47.3 % (ref 38.5–50.0)
Hemoglobin: 16.4 g/dL (ref 13.2–17.1)
Lymphs Abs: 2367 cells/uL (ref 850–3900)
MCH: 32.9 pg (ref 27.0–33.0)
MCHC: 34.7 g/dL (ref 32.0–36.0)
MCV: 95 fL (ref 80.0–100.0)
MPV: 12.1 fL (ref 7.5–12.5)
Monocytes Relative: 9.9 %
Neutro Abs: 2959 cells/uL (ref 1500–7800)
Neutrophils Relative %: 48.5 %
Platelets: 192 10*3/uL (ref 140–400)
RBC: 4.98 10*6/uL (ref 4.20–5.80)
RDW: 12.8 % (ref 11.0–15.0)
Total Lymphocyte: 38.8 %
WBC: 6.1 10*3/uL (ref 3.8–10.8)

## 2021-08-27 LAB — COMPLETE METABOLIC PANEL WITH GFR
AG Ratio: 1.6 (calc) (ref 1.0–2.5)
ALT: 24 U/L (ref 9–46)
AST: 23 U/L (ref 10–35)
Albumin: 4 g/dL (ref 3.6–5.1)
Alkaline phosphatase (APISO): 47 U/L (ref 35–144)
BUN: 19 mg/dL (ref 7–25)
CO2: 31 mmol/L (ref 20–32)
Calcium: 9.1 mg/dL (ref 8.6–10.3)
Chloride: 100 mmol/L (ref 98–110)
Creat: 1.23 mg/dL (ref 0.70–1.28)
Globulin: 2.5 g/dL (calc) (ref 1.9–3.7)
Glucose, Bld: 106 mg/dL — ABNORMAL HIGH (ref 65–99)
Potassium: 3.8 mmol/L (ref 3.5–5.3)
Sodium: 140 mmol/L (ref 135–146)
Total Bilirubin: 0.7 mg/dL (ref 0.2–1.2)
Total Protein: 6.5 g/dL (ref 6.1–8.1)
eGFR: 63 mL/min/{1.73_m2} (ref >=60)

## 2021-08-27 LAB — LIPID PANEL
Cholesterol: 201 mg/dL — ABNORMAL HIGH (ref <200)
HDL: 43 mg/dL (ref >=40)
LDL Cholesterol (Calc): 129 mg/dL (calc) — ABNORMAL HIGH
Non-HDL Cholesterol (Calc): 158 mg/dL (calc) — ABNORMAL HIGH (ref <130)
Total CHOL/HDL Ratio: 4.7 (calc) (ref <5.0)
Triglycerides: 177 mg/dL — ABNORMAL HIGH (ref <150)

## 2021-08-27 LAB — HEMOGLOBIN A1C
Hgb A1c MFr Bld: 6.1 % of total Hgb — ABNORMAL HIGH (ref <5.7)
Mean Plasma Glucose: 128 mg/dL
eAG (mmol/L): 7.1 mmol/L

## 2021-08-27 LAB — PSA: PSA: 0.74 ng/mL (ref <=4.00)

## 2021-08-30 ENCOUNTER — Encounter: Payer: Self-pay | Admitting: Internal Medicine

## 2021-08-30 DIAGNOSIS — E782 Mixed hyperlipidemia: Secondary | ICD-10-CM

## 2021-09-01 DIAGNOSIS — E782 Mixed hyperlipidemia: Secondary | ICD-10-CM | POA: Insufficient documentation

## 2021-09-01 DIAGNOSIS — E1169 Type 2 diabetes mellitus with other specified complication: Secondary | ICD-10-CM | POA: Insufficient documentation

## 2021-09-01 MED ORDER — ATORVASTATIN CALCIUM 10 MG PO TABS: 10.0000 mg | ORAL_TABLET | Freq: Every day | ORAL | 1 refills | Status: DC

## 2021-09-09 DIAGNOSIS — Z79899 Other long term (current) drug therapy: Secondary | ICD-10-CM | POA: Diagnosis not present

## 2021-09-09 DIAGNOSIS — L4050 Arthropathic psoriasis, unspecified: Secondary | ICD-10-CM | POA: Diagnosis not present

## 2021-10-05 DIAGNOSIS — Z6834 Body mass index (BMI) 34.0-34.9, adult: Secondary | ICD-10-CM | POA: Diagnosis not present

## 2021-10-05 DIAGNOSIS — M4319 Spondylolisthesis, multiple sites in spine: Secondary | ICD-10-CM | POA: Diagnosis not present

## 2021-10-25 DIAGNOSIS — L4050 Arthropathic psoriasis, unspecified: Secondary | ICD-10-CM | POA: Diagnosis not present

## 2021-10-25 DIAGNOSIS — M10472 Other secondary gout, left ankle and foot: Secondary | ICD-10-CM | POA: Diagnosis not present

## 2021-10-25 DIAGNOSIS — Z79899 Other long term (current) drug therapy: Secondary | ICD-10-CM | POA: Diagnosis not present

## 2021-11-08 DIAGNOSIS — M10472 Other secondary gout, left ankle and foot: Secondary | ICD-10-CM | POA: Diagnosis not present

## 2021-12-06 DIAGNOSIS — M79605 Pain in left leg: Secondary | ICD-10-CM | POA: Diagnosis not present

## 2021-12-06 DIAGNOSIS — R200 Anesthesia of skin: Secondary | ICD-10-CM | POA: Diagnosis not present

## 2021-12-20 ENCOUNTER — Other Ambulatory Visit: Payer: Self-pay | Admitting: Neurosurgery

## 2021-12-20 DIAGNOSIS — M4319 Spondylolisthesis, multiple sites in spine: Secondary | ICD-10-CM

## 2021-12-25 ENCOUNTER — Ambulatory Visit
Admission: RE | Admit: 2021-12-25 | Discharge: 2021-12-25 | Disposition: A | Discharge status: Home / Self Care | Payer: Medicare Other | Source: Ambulatory Visit | Attending: Neurosurgery | Admitting: Neurosurgery

## 2021-12-25 DIAGNOSIS — M4319 Spondylolisthesis, multiple sites in spine: Secondary | ICD-10-CM | POA: Diagnosis not present

## 2021-12-25 DIAGNOSIS — M545 Low back pain, unspecified: Secondary | ICD-10-CM | POA: Diagnosis not present

## 2021-12-25 DIAGNOSIS — M4316 Spondylolisthesis, lumbar region: Secondary | ICD-10-CM | POA: Diagnosis not present

## 2021-12-25 MED ORDER — GADOBUTROL 1 MMOL/ML IV SOLN
10.0000 mL | Freq: Once | INTRAVENOUS | Status: AC | PRN
  Administered 2021-12-25: 10 mL via INTRAVENOUS

## 2021-12-30 DIAGNOSIS — Z6835 Body mass index (BMI) 35.0-35.9, adult: Secondary | ICD-10-CM | POA: Diagnosis not present

## 2021-12-30 DIAGNOSIS — M4319 Spondylolisthesis, multiple sites in spine: Secondary | ICD-10-CM | POA: Diagnosis not present

## 2022-01-07 DIAGNOSIS — D2262 Melanocytic nevi of left upper limb, including shoulder: Secondary | ICD-10-CM | POA: Diagnosis not present

## 2022-01-07 DIAGNOSIS — D2271 Melanocytic nevi of right lower limb, including hip: Secondary | ICD-10-CM | POA: Diagnosis not present

## 2022-01-07 DIAGNOSIS — D2261 Melanocytic nevi of right upper limb, including shoulder: Secondary | ICD-10-CM | POA: Diagnosis not present

## 2022-01-07 DIAGNOSIS — L570 Actinic keratosis: Secondary | ICD-10-CM | POA: Diagnosis not present

## 2022-01-07 DIAGNOSIS — L400 Psoriasis vulgaris: Secondary | ICD-10-CM | POA: Diagnosis not present

## 2022-01-07 DIAGNOSIS — L4059 Other psoriatic arthropathy: Secondary | ICD-10-CM | POA: Diagnosis not present

## 2022-01-11 ENCOUNTER — Other Ambulatory Visit: Payer: Medicare Other

## 2022-01-11 ENCOUNTER — Encounter: Payer: Self-pay | Admitting: Internal Medicine

## 2022-01-11 DIAGNOSIS — E782 Mixed hyperlipidemia: Secondary | ICD-10-CM | POA: Diagnosis not present

## 2022-01-12 DIAGNOSIS — Z79899 Other long term (current) drug therapy: Secondary | ICD-10-CM | POA: Diagnosis not present

## 2022-01-12 DIAGNOSIS — L4050 Arthropathic psoriasis, unspecified: Secondary | ICD-10-CM | POA: Diagnosis not present

## 2022-01-12 DIAGNOSIS — M1A00X0 Idiopathic chronic gout, unspecified site, without tophus (tophi): Secondary | ICD-10-CM | POA: Diagnosis not present

## 2022-01-12 DIAGNOSIS — M10472 Other secondary gout, left ankle and foot: Secondary | ICD-10-CM | POA: Diagnosis not present

## 2022-01-12 LAB — COMPLETE METABOLIC PANEL WITH GFR
AG Ratio: 2 (calc) (ref 1.0–2.5)
ALT: 28 U/L (ref 9–46)
AST: 22 U/L (ref 10–35)
Albumin: 4.2 g/dL (ref 3.6–5.1)
Alkaline phosphatase (APISO): 59 U/L (ref 35–144)
BUN: 22 mg/dL (ref 7–25)
CO2: 33 mmol/L — ABNORMAL HIGH (ref 20–32)
Calcium: 9.2 mg/dL (ref 8.6–10.3)
Chloride: 99 mmol/L (ref 98–110)
Creat: 1.28 mg/dL (ref 0.70–1.28)
Globulin: 2.1 g/dL (calc) (ref 1.9–3.7)
Glucose, Bld: 126 mg/dL — ABNORMAL HIGH (ref 65–99)
Potassium: 3.9 mmol/L (ref 3.5–5.3)
Sodium: 140 mmol/L (ref 135–146)
Total Bilirubin: 0.7 mg/dL (ref 0.2–1.2)
Total Protein: 6.3 g/dL (ref 6.1–8.1)
eGFR: 60 mL/min/{1.73_m2} (ref >=60)

## 2022-01-12 LAB — LIPID PANEL
Cholesterol: 113 mg/dL (ref <200)
HDL: 36 mg/dL — ABNORMAL LOW (ref >=40)
LDL Cholesterol (Calc): 59 mg/dL (calc)
Non-HDL Cholesterol (Calc): 77 mg/dL (calc) (ref <130)
Total CHOL/HDL Ratio: 3.1 (calc) (ref <5.0)
Triglycerides: 92 mg/dL (ref <150)

## 2022-01-18 ENCOUNTER — Other Ambulatory Visit: Payer: Self-pay | Admitting: Internal Medicine

## 2022-01-18 NOTE — Telephone Encounter (Signed)
Requested medication (s) are due for refill today: yes  Requested medication (s) are on the active medication list: yes  Last refill:  09/01/21 #90 1 refills  Future visit scheduled: no  Notes to clinic:  no previous OV to establish care noted in review of chart. Do you want to refill Rx?     Requested Prescriptions  Pending Prescriptions Disp Refills   atorvastatin (LIPITOR) 10 MG tablet [Pharmacy Med Name: ATORVASTATIN TABS '10MG'$ ] 90 tablet 3    Sig: TAKE 1 TABLET DAILY     Cardiovascular:  Antilipid - Statins Failed - 01/18/2022  3:24 AM      Failed - Lipid Panel in normal range within the last 12 months    Cholesterol, Total  Date Value Ref Range Status  10/14/2015 216 (H) 100 - 199 mg/dL Final   Cholesterol  Date Value Ref Range Status  01/11/2022 113 <200 mg/dL Final   LDL Cholesterol (Calc)  Date Value Ref Range Status  01/11/2022 59 mg/dL (calc) Final    Comment:    Reference range: <100 . Desirable range <100 mg/dL for primary prevention;   <70 mg/dL for patients with CHD or diabetic patients  with > or = 2 CHD risk factors. Marland Kitchen LDL-C is now calculated using the Martin-Hopkins  calculation, which is a validated novel method providing  better accuracy than the Friedewald equation in the  estimation of LDL-C.  Cresenciano Genre et al. Annamaria Helling. [id number];863(81): 2061-2068  (http://education.[facility].com/faq/FAQ164)    HDL  Date Value Ref Range Status  01/11/2022 36 (L) > OR = 40 mg/dL Final  10/14/2015 39 (L) >39 mg/dL Final   Triglycerides  Date Value Ref Range Status  01/11/2022 92 <150 mg/dL Final         Passed - Patient is not pregnant      Passed - Valid encounter within last 12 months    Recent Outpatient Visits   None

## 2022-02-23 ENCOUNTER — Other Ambulatory Visit: Payer: Self-pay | Admitting: Internal Medicine

## 2022-02-23 NOTE — Telephone Encounter (Signed)
Requested medication (s) are due for refill today: yes  Requested medication (s) are on the active medication list: yes  Last refill:  08/26/21 #90 1 RF  Future visit scheduled: Yes 02/25/22  Notes to clinic:  do you want to refill? Has appt this Friday  and should have enough to last-   Requested Prescriptions  Pending Prescriptions Disp Refills   triamterene-hydrochlorothiazide (DYAZIDE) 37.5-25 MG capsule [Pharmacy Med Name: TRIAMTERENE/HCTZ CAPS 37.5/'25MG'$ ] 90 capsule 3    Sig: TAKE 1 CAPSULE DAILY (PLEASE SCHEDULE OFFICE VISIT BEFORE ANYMORE REFILLS)     Cardiovascular: Diuretic Combos Failed - 02/23/2022  2:22 AM      Failed - Last BP in normal range    BP Readings from Last 1 Encounters:  08/26/21 (!) 156/88         Failed - Valid encounter within last 6 months    Recent Outpatient Visits   None     Future Appointments             In 2 days Jearld Fenton, NP [facility], [place] in normal range and within 180 days    Potassium  Date Value Ref Range Status  01/11/2022 3.9 3.5 - 5.3 mmol/L Final         Passed - Na in normal range and within 180 days    Sodium  Date Value Ref Range Status  01/11/2022 140 135 - 146 mmol/L Final  10/14/2015 142 134 - 144 mmol/L Final         Passed - Cr in normal range and within 180 days    Creat  Date Value Ref Range Status  01/11/2022 1.28 0.70 - 1.28 mg/dL Final

## 2022-02-25 ENCOUNTER — Encounter: Payer: Self-pay | Admitting: Internal Medicine

## 2022-02-25 ENCOUNTER — Ambulatory Visit (INDEPENDENT_AMBULATORY_CARE_PROVIDER_SITE_OTHER): Payer: Medicare Other | Admitting: Internal Medicine

## 2022-02-25 VITALS — BP 158/82 | HR 74 | Temp 97.1°F | Wt 235.0 lb

## 2022-02-25 DIAGNOSIS — M10272 Drug-induced gout, left ankle and foot: Secondary | ICD-10-CM

## 2022-02-25 DIAGNOSIS — L4050 Arthropathic psoriasis, unspecified: Secondary | ICD-10-CM | POA: Diagnosis not present

## 2022-02-25 DIAGNOSIS — G4733 Obstructive sleep apnea (adult) (pediatric): Secondary | ICD-10-CM | POA: Diagnosis not present

## 2022-02-25 DIAGNOSIS — R7303 Prediabetes: Secondary | ICD-10-CM

## 2022-02-25 DIAGNOSIS — E6609 Other obesity due to excess calories: Secondary | ICD-10-CM | POA: Diagnosis not present

## 2022-02-25 DIAGNOSIS — E1165 Type 2 diabetes mellitus with hyperglycemia: Secondary | ICD-10-CM

## 2022-02-25 DIAGNOSIS — K219 Gastro-esophageal reflux disease without esophagitis: Secondary | ICD-10-CM

## 2022-02-25 DIAGNOSIS — Z6834 Body mass index (BMI) 34.0-34.9, adult: Secondary | ICD-10-CM

## 2022-02-25 DIAGNOSIS — I158 Other secondary hypertension: Secondary | ICD-10-CM

## 2022-02-25 DIAGNOSIS — M109 Gout, unspecified: Secondary | ICD-10-CM | POA: Insufficient documentation

## 2022-02-25 DIAGNOSIS — I700 Atherosclerosis of aorta: Secondary | ICD-10-CM

## 2022-02-25 DIAGNOSIS — E782 Mixed hyperlipidemia: Secondary | ICD-10-CM | POA: Diagnosis not present

## 2022-02-25 LAB — POCT GLYCOSYLATED HEMOGLOBIN (HGB A1C): Hemoglobin A1C: 6.5 % — AB (ref 4.0–5.6)

## 2022-02-25 MED ORDER — OLMESARTAN MEDOXOMIL-HCTZ 20-12.5 MG PO TABS: 1.0000 | ORAL_TABLET | Freq: Every day | ORAL | 1 refills | Status: DC

## 2022-02-25 NOTE — Patient Instructions (Signed)

## 2022-02-25 NOTE — Assessment & Plan Note (Signed)
Lipid profile reviewed Encouraged him to consume a low-fat diet Continue atorvastatin

## 2022-02-25 NOTE — Assessment & Plan Note (Signed)
Encourage weight loss as this can help reduce sleep apnea symptoms Continue CPAP 

## 2022-02-25 NOTE — Assessment & Plan Note (Signed)
Continue Humira He will continue to follow with rheumatology
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
## 2022-02-25 NOTE — Assessment & Plan Note (Signed)
Encourage diet and exercise for weight loss 

## 2022-02-25 NOTE — Assessment & Plan Note (Signed)
Continue allopurinol and colchicine Uric acid checked by rheumatology

## 2022-02-25 NOTE — Assessment & Plan Note (Signed)
Try to identify and avoid foods that trigger reflux Encourage weight loss as this can help reduce reflux Okay to take Tums or Rolaids OTC as needed

## 2022-02-25 NOTE — Assessment & Plan Note (Signed)
Uncontrolled We will D/C triamterene HCT Rx for olmesartan HCT 20-12 0.5 Reinforced DASH diet and exercise for weight loss C-Met reviewed

## 2022-02-25 NOTE — Assessment & Plan Note (Signed)
Lipid profile reviewed Encouraged him to consume a low-fat diet Continue atorvastatin and aspirin

## 2022-02-25 NOTE — Addendum Note (Signed)
Addended by: Ashley Royalty E on: 02/25/2022 10:42 AM   Modules accepted: Orders

## 2022-02-25 NOTE — Assessment & Plan Note (Addendum)
POCT A1c 6.5% Encourage low-carb diet and exercise for weight loss

## 2022-03-03 DIAGNOSIS — H43813 Vitreous degeneration, bilateral: Secondary | ICD-10-CM | POA: Diagnosis not present

## 2022-03-25 ENCOUNTER — Telehealth: Payer: Self-pay | Admitting: Internal Medicine

## 2022-03-25 NOTE — Telephone Encounter (Signed)
Left message for patient to call back and schedule the Medicare Annual Wellness Visit (AWV) virtually or by telephone.  Last AWV 07/23/20  Please schedule at anytime with [facility].    Any questions, please call me at [phone]

## 2022-03-28 ENCOUNTER — Ambulatory Visit: Payer: Medicare Other

## 2022-03-28 NOTE — Progress Notes (Signed)
Hypertension, follow-up  BP Readings from Last 3 Encounters:  02/25/22 (!) 158/82  08/26/21 (!) 156/88  05/28/21 128/78   Wt Readings from Last 3 Encounters:  02/25/22 235 lb (106.6 kg)  08/26/21 237 lb (107.5 kg)  04/28/21 232 lb 6.4 oz (105.4 kg)     He was last seen for hypertension 02/25/2022 BP at that visit was 158/82. Management since that visit includes changing to olmesartan HCT 20/12.'5mg'$  daily.  He reports excellent compliance with treatment. He is not having side effects.  ----------- 124/70 right arm

## 2022-04-06 DIAGNOSIS — R635 Abnormal weight gain: Secondary | ICD-10-CM | POA: Diagnosis not present

## 2022-04-06 DIAGNOSIS — Z79899 Other long term (current) drug therapy: Secondary | ICD-10-CM | POA: Diagnosis not present

## 2022-04-06 DIAGNOSIS — M1A00X0 Idiopathic chronic gout, unspecified site, without tophus (tophi): Secondary | ICD-10-CM | POA: Diagnosis not present

## 2022-04-06 DIAGNOSIS — L4050 Arthropathic psoriasis, unspecified: Secondary | ICD-10-CM | POA: Diagnosis not present

## 2022-04-17 IMAGING — MR MR LUMBAR SPINE W/O CM
5 series · 30 of 48 positions shown · non-contrast
Comparison: MRI 03/10/2017

CLINICAL DATA: Low back pain, left hip and leg pain radiating to
the calf. Numbness in the left foot. Symptoms of many years
duration.

EXAM:
MRI LUMBAR SPINE WITHOUT CONTRAST
TECHNIQUE: Multiplanar, multisequence MR imaging of the lumbar spine was
performed. No intravenous contrast was administered.

[Series 5: T2 · sagittal · 4.0mm · 0.81mm/px · 6 of 17 slices shown (1 of 2)]
[im 1/17]
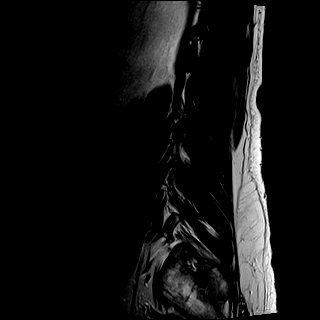
[im 4/17]
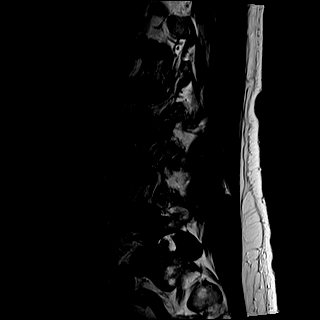
[im 7/17]
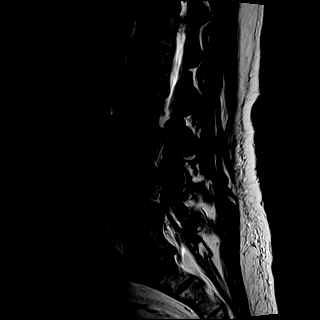
[im 10/17]
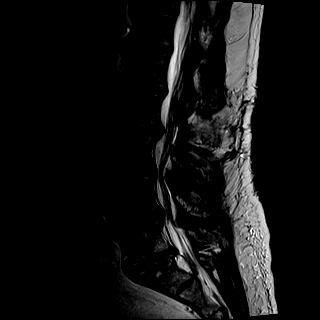
[im 13/17]
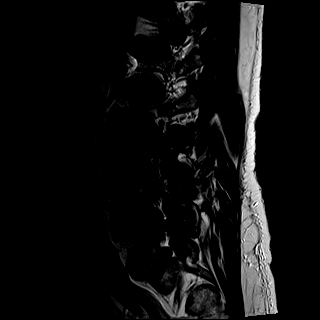
[im 17/17]
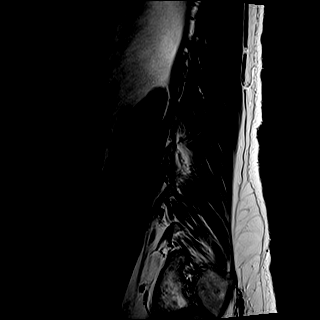

[Series 6: T1 · sagittal · 4.0mm · 0.81mm/px · 7 of 17 slices shown (1 of 2)]
[im 1/17]
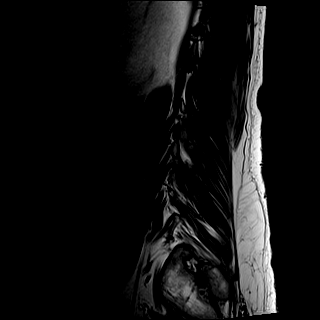
[im 3/17]
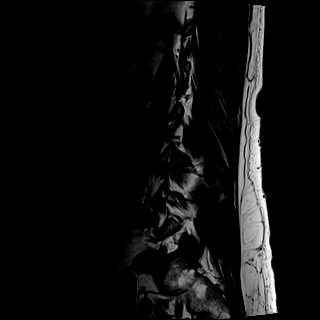
[im 6/17]
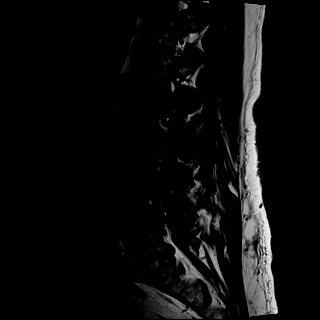
[im 9/17]
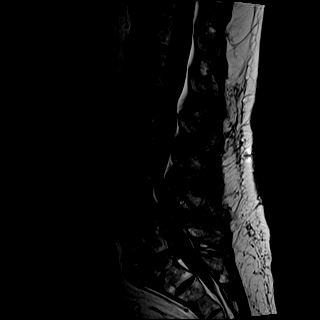
[im 11/17]
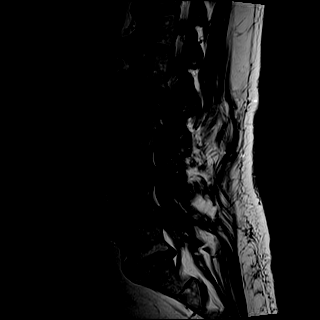
[im 14/17]
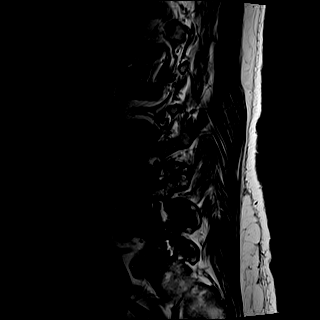
[im 17/17]
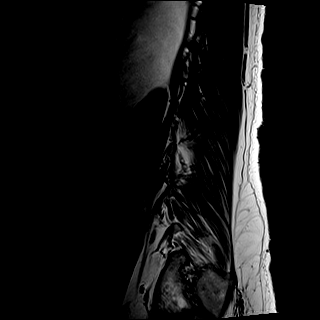

[Series 7: STIR · sagittal · 4.0mm · 0.41mm/px · 1 of 17 slices shown]
[im 1/17]
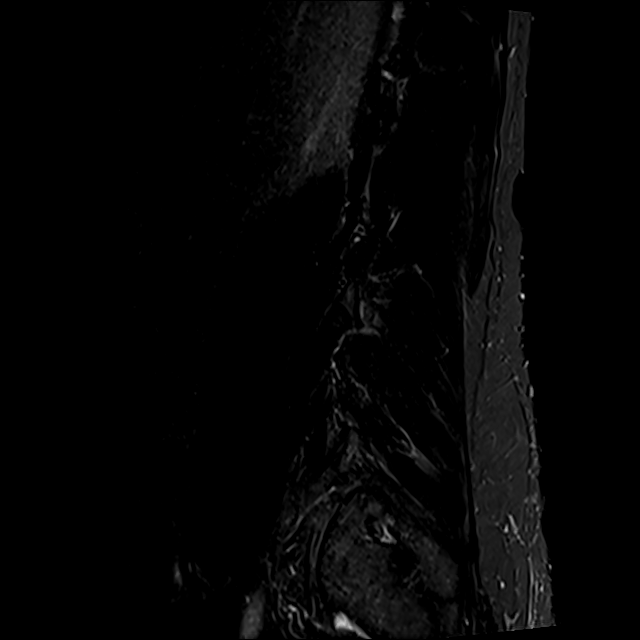

[Series 8: T2 · axial · 4.0mm · 0.78mm/px · z∈[-114,+100]mm · 8 of 37 slices shown (2 of 2)]
[im 1/37]
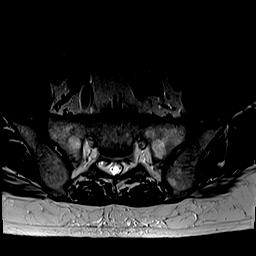
[im 6/37]
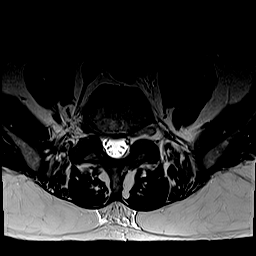
[im 12/37]
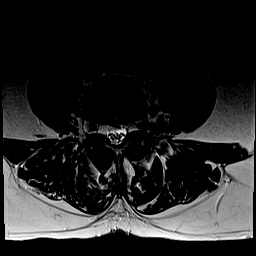
[im 17/37]
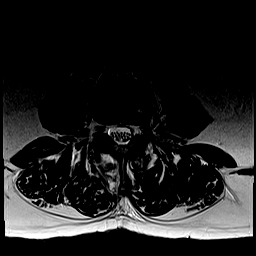
[im 20/37]
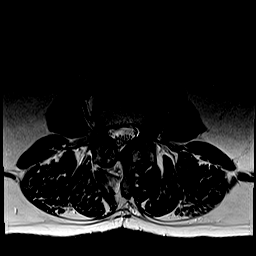
[im 25/37]
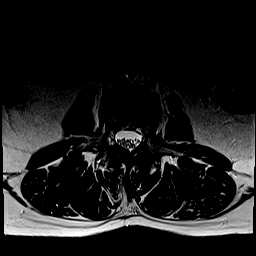
[im 31/37]
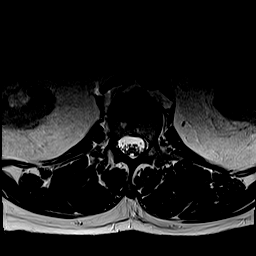
[im 37/37]
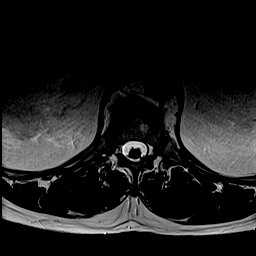

[Series 9: T1 · axial · 4.0mm · 0.39mm/px · z∈[-114,+100]mm · 8 of 37 slices shown (2 of 2)]
[im 1/37]
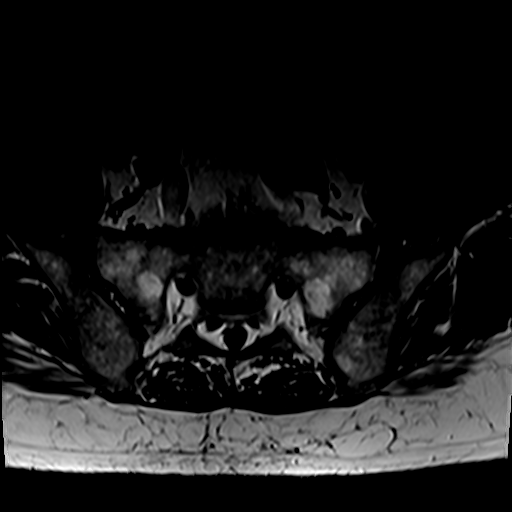
[im 6/37]
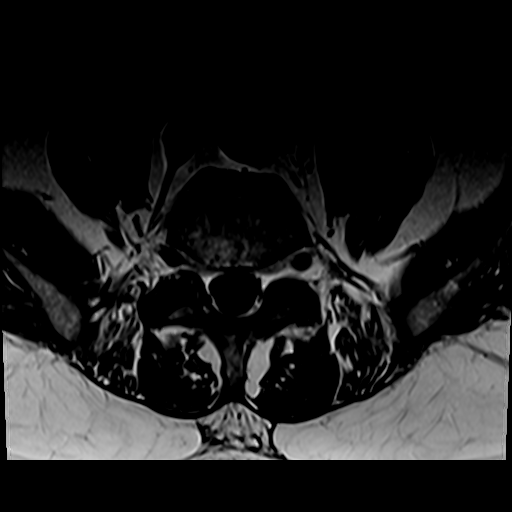
[im 12/37]
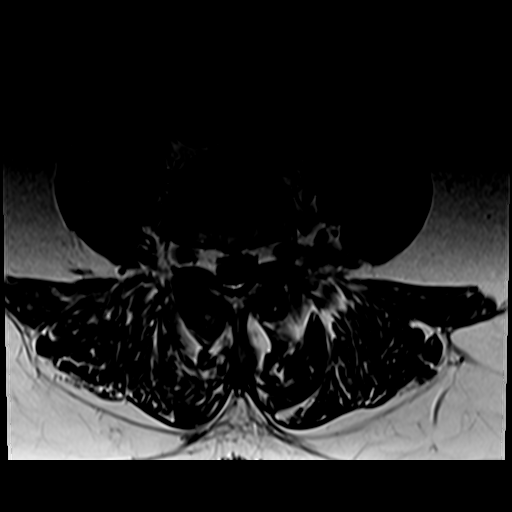
[im 17/37]
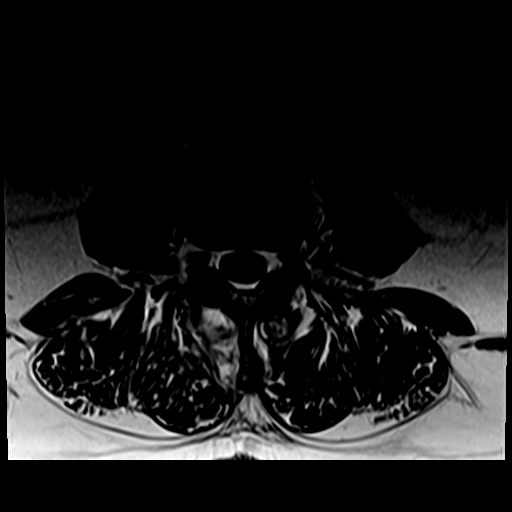
[im 20/37]
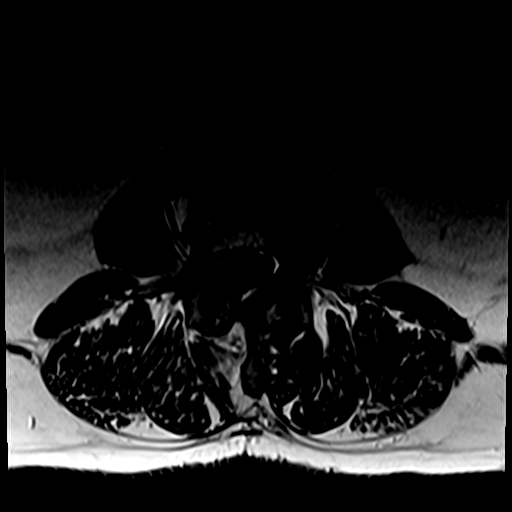
[im 25/37]
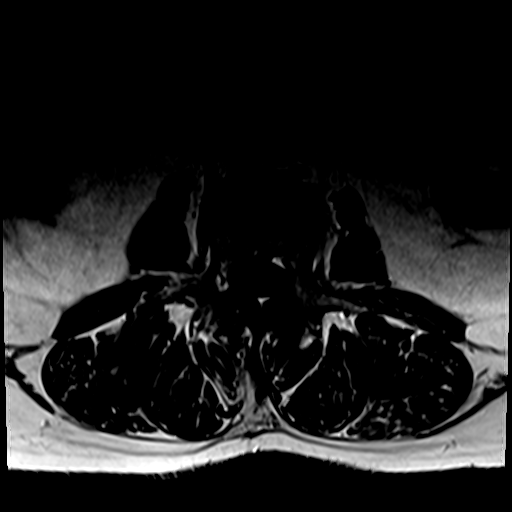
[im 31/37]
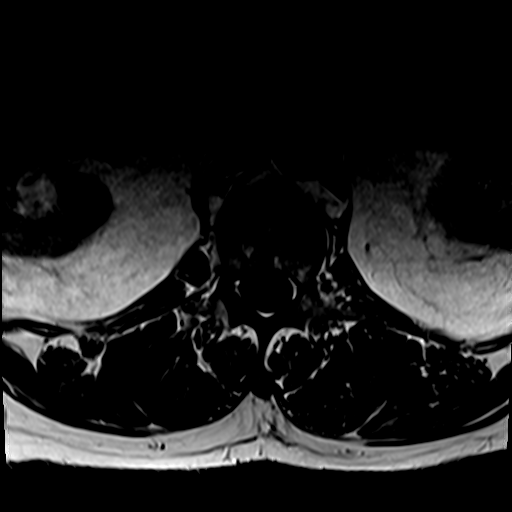
[im 37/37]
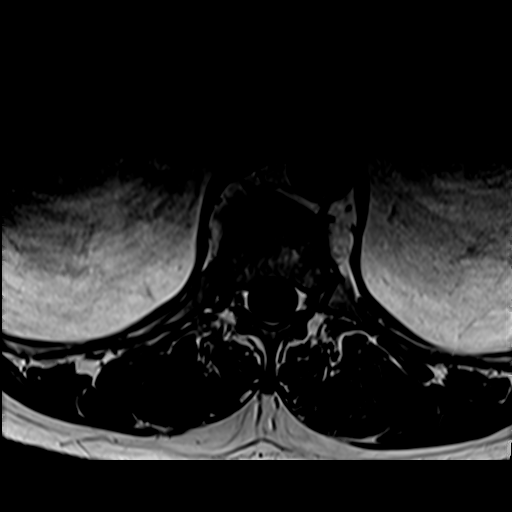

[30 of 48 positions shown; findings below may reference images not displayed]

FINDINGS: Segmentation:  5 lumbar type vertebral bodies.

Alignment: 3 mm degenerative anterolisthesis L4-5. Otherwise normal
alignment.

Vertebrae: No fracture or primary bone lesion. No edematous
discogenic endplate changes.

Conus medullaris and cauda equina: Conus extends to the L1-2 level.
Conus and cauda equina appear normal.

Paraspinal and other soft tissues: Negative

Disc levels:

T11-12, T12-L1 and L1-2: Mild noncompressive disc bulges. No change
since the previous exam.

L2-3: Interval right hemilaminectomy and discectomy. Endplate
osteophytes and small amount of protruding disc material in the
right posterolateral direction, indenting the right-side of the
thecal sac. The right L2 nerve appears exit without compression.
Because of the posterior decompression, there is no likely neural
compression in the right lateral recess.

L3-4: Endplate osteophytes and bulging disc material. Facet and
ligamentous hypertrophy. Moderate multifactorial stenosis that could
cause neural compression on either or both sides. Similar appearance
to the previous study.

L4-5: Advanced bilateral facet arthropathy with 3 mm of degenerative
anterolisthesis. Shallow protrusion of the disc more prominent
towards the left. Facet and ligamentous hypertrophy more prominent
on the left. Moderate multifactorial spinal stenosis of the canal,
lateral recesses and foramina, left more than right. Some potential
for neural compression on both sides, but particularly on the left
in the lateral recess and intervertebral foramen. The facet
arthropathy could also be a cause of back pain or referred facet
syndrome pain. Findings have worsened since the study of 6942. Small
synovial cyst previously seen on the right is no longer specifically
identifiable.

L5-S1: Chronic disc degeneration with loss of disc height. Endplate
osteophytes and bulging of the disc. No compressive narrowing of the
canal or foramina. No change since the previous study.
IMPRESSION: L2-3: Interval right hemilaminectomy and discectomy. Endplate
osteophytes and protruding disc material more prominent towards the
right, with some narrowing of the right lateral recess but no likely
neural compression because of the posterior decompression.

L3-4: Moderate multifactorial spinal stenosis that could cause
neural compression on either or both sides. Similar appearance to
the previous study.

L4-5: Worsening facet arthropathy, with anterolisthesis of 3 mm.
Shallow protrusion of the disc more prominent towards the left.
Moderate canal stenosis. Narrowing of the lateral recesses and
neural foramina left more than right. Neural compression could occur
on the left at this level in particular. The facet arthropathy could
also be a cause of back pain or referred facet syndrome pain.

L5-S1: Chronic disc degeneration. No change. No compressive
stenosis.

## 2022-04-19 ENCOUNTER — Other Ambulatory Visit: Payer: Self-pay | Admitting: Internal Medicine

## 2022-04-19 NOTE — Telephone Encounter (Signed)
Requested Prescriptions  Pending Prescriptions Disp Refills   atorvastatin (LIPITOR) 10 MG tablet [Pharmacy Med Name: ATORVASTATIN TABS '10MG'$ ] 90 tablet 2    Sig: TAKE 1 TABLET DAILY     Cardiovascular:  Antilipid - Statins Failed - 04/19/2022  2:00 AM      Failed - Lipid Panel in normal range within the last 12 months    Cholesterol, Total  Date Value Ref Range Status  10/14/2015 216 (H) 100 - 199 mg/dL Final   Cholesterol  Date Value Ref Range Status  01/11/2022 113 <200 mg/dL Final   LDL Cholesterol (Calc)  Date Value Ref Range Status  01/11/2022 59 mg/dL (calc) Final    Comment:    Reference range: <100 . Desirable range <100 mg/dL for primary prevention;   <70 mg/dL for patients with CHD or diabetic patients  with > or = 2 CHD risk factors. Marland Kitchen LDL-C is now calculated using the Martin-Hopkins  calculation, which is a validated novel method providing  better accuracy than the Friedewald equation in the  estimation of LDL-C.  Cresenciano Genre et al. Annamaria Helling. 2549;826(41): 2061-2068  (http://education.[employer].com/faq/FAQ164)    HDL  Date Value Ref Range Status  01/11/2022 36 (L) > OR = 40 mg/dL Final  10/14/2015 39 (L) >39 mg/dL Final   Triglycerides  Date Value Ref Range Status  01/11/2022 92 <150 mg/dL Final         Passed - Patient is not pregnant      Passed - Valid encounter within last 12 months    Recent Outpatient Visits           1 month ago Aortic atherosclerosis [facility])   [facility] [place], Coralie Keens, NP       Future Appointments             In 1 month [place], Coralie Keens, NP [facility], [place]   In 4 months [place], Coralie Keens, NP [facility], [facility]

## 2022-06-06 ENCOUNTER — Ambulatory Visit (INDEPENDENT_AMBULATORY_CARE_PROVIDER_SITE_OTHER): Payer: Medicare Other | Admitting: Internal Medicine

## 2022-06-06 ENCOUNTER — Encounter: Payer: Self-pay | Admitting: Internal Medicine

## 2022-06-06 ENCOUNTER — Telehealth: Payer: Self-pay | Admitting: Internal Medicine

## 2022-06-06 VITALS — BP 138/70 | HR 60 | Temp 98.0°F | Ht 69.0 in | Wt 235.6 lb

## 2022-06-06 DIAGNOSIS — G4733 Obstructive sleep apnea (adult) (pediatric): Secondary | ICD-10-CM

## 2022-06-06 NOTE — Patient Instructions (Signed)
AWESOME JOB!! A+  Continue CPAP as prescribed

## 2022-06-06 NOTE — Progress Notes (Signed)
[facility] Consultation       Date: 06/06/2022  MRN# [id number] Rc [place]ston [birth year]   The patient is 73 year old male, with a history of obstructive sleep apnea. He continues to do well CPAP, he is using it every night, he is more awake during the day.  He remains more awake during the day, he is not snoring. He is getting new supplies every 3 months and cleans them every week.   **Download dated 03/06/2018-04/04/2018>> raw data personally reviewed, usage greater than 4 hours is 30/30 days.  Average usage on days used is 7 hours 37 minutes, pressure range 5-20.  Leaks are slightly elevated but within normal limits.  Pressure median 7, 95th percentile pressure 10, maximum pressure 12.  Residual AHI is 2.6.  Overall this shows excellent control of obstructive sleep apnea with excellent compliance with CPAP. Download data review, 30 days as of 04/09/17.  Usage is 30/30 days.  Average usage on days used is 7 hours 30 minutes.  Setting is auto, 5-20.  Median pressure is 8.1, 95th percentile pressure is 11.6, maximum pressure is 13.  Residual AHI is 3.8.  Review of outside sleep studies; -Home sleep study 12/12/16; AHI of 24. -Baseline sleep study. 05/27/2005, AHI was 40, increased to 52 when supine. -CPAP titration 08/06/2005; CPAP titrated to a pressure level of 8. Overall these tests show severe affective sleep apnea, adequately treated, at a CPAP level of 8    CC Follow-up OSA   HPI Patient with a previous history of severe OSA greater than 52 Doing really well with CPAP therapy No acute issues Compliance report is excellent with 100% days and 100% greater than 4 hours AHI reduced to 2.4 Auto CPAP 5-15 Compliance report reviewed in detail with patient  No exacerbation at this time No evidence of heart failure at this time No evidence or signs of infection at this time No respiratory distress No fevers, chills, nausea, vomiting, diarrhea No evidence of lower  extremity edema No evidence hemoptysis   Medication:    Current Outpatient Medications:    Adalimumab (HUMIRA) 40 MG/0.4ML PSKT, Inject into the skin every 14 (fourteen) days., Disp: , Rfl:    aspirin EC 81 MG tablet, Take 1 tablet (81 mg total) by mouth daily. Swallow whole., Disp: 30 tablet, Rfl: 12   atorvastatin (LIPITOR) 10 MG tablet, TAKE 1 TABLET DAILY, Disp: 90 tablet, Rfl: 2   colchicine 0.6 MG tablet, Take 0.6 mg by mouth daily., Disp: , Rfl:    hydrocortisone cream 1 %, Apply 1 application topically 2 (two) times daily as needed for itching (psoriasis)., Disp: , Rfl:    loratadine (CLARITIN) 10 MG tablet, Take 10 mg by mouth at bedtime. , Disp: , Rfl:    Multiple Vitamin (MULTIVITAMIN) tablet, Take 1 tablet by mouth daily., Disp: , Rfl:    Omega-3 Fatty Acids (FISH OIL) 1200 MG CPDR, Take 1,200 mg by mouth daily., Disp: , Rfl:    valsartan-hydrochlorothiazide (DIOVAN-HCT) 80-12.5 MG tablet, Take 1 tablet by mouth daily., Disp: , Rfl:    Wheat Dextrin (BENEFIBER PO), Take by mouth., Disp: , Rfl:    allopurinol (ZYLOPRIM) 100 MG tablet, Take 300 mg by mouth daily., Disp: , Rfl:    Allergies:  Duloxetine hcl, Shellfish allergy, Amoxicillin, Betadine [povidone iodine], and Other      BP 138/70 (BP Location: Left Arm, Cuff Size: Normal)   Pulse 60   Temp 98 F (36.7 C) (Temporal)   Ht 5\' 9"  (  1.753 m)   Wt 235 lb 9.6 oz (106.9 kg)   SpO2 95%   BMI 34.79 kg/m      Review of Systems: Gen:  Denies  fever, sweats, chills weight loss  HEENT: Denies blurred vision, double vision, ear pain, eye pain, hearing loss, nose bleeds, sore throat Cardiac:  No dizziness, chest pain or heaviness, chest tightness,edema, No JVD Resp:   No cough, -sputum production, -shortness of breath,-wheezing, -hemoptysis,  Other:  All other systems negative   Physical Examination:   General Appearance: No distress  EYES PERRLA, EOM intact.   NECK Supple, No JVD Pulmonary: normal breath  sounds, No wheezing.  CardiovascularNormal S1,S2.  No m/r/g.   Abdomen: Benign, Soft, non-tender. Neurology UE/LE 5/5 strength, no focal deficits Ext pulses intact, cap refill intact ALL OTHER ROS ARE NEGATIVE     Assessment and Plan:  Severe obstructive sleep apnea. CPAP 5-15 cm h20 Patient use and benefits from therapy Compliance report reviewed in detail with patient Excellent compliance report AHI reduced to 2.4   Ho Pulmonary embolism. PROVOKED Patient developed a DVT from PICC line, then developed PE. Is now off of anticoagulation.     MEDICATION ADJUSTMENTS/LABS AND TESTS ORDERED: Continue CPAP as prescribed   CURRENT MEDICATIONS REVIEWED AT LENGTH WITH PATIENT TODAY  Follow-up in 1 year  Total time spent 12 minutes   Patient satisfied with Plan of action and management. All questions answered    Corrin Parker, M.D.  [facility]  Medical Director [place] Director [facility] Cardio-Pulmonary Department

## 2022-06-06 NOTE — Telephone Encounter (Signed)
Contacted [name] to schedule their annual wellness visit. Call back at later date: 06/08/2022  Sherol Dade; [facility] Direct Dial: [phone]

## 2022-06-07 ENCOUNTER — Encounter: Payer: Self-pay | Admitting: Internal Medicine

## 2022-06-07 NOTE — Telephone Encounter (Signed)
Dr. Kasa, please advise. Thanks °

## 2022-06-16 ENCOUNTER — Encounter: Payer: Self-pay | Admitting: Internal Medicine

## 2022-06-16 ENCOUNTER — Ambulatory Visit (INDEPENDENT_AMBULATORY_CARE_PROVIDER_SITE_OTHER): Payer: Medicare Other | Admitting: Internal Medicine

## 2022-06-16 VITALS — BP 136/80 | HR 63 | Temp 96.3°F | Wt 233.0 lb

## 2022-06-16 DIAGNOSIS — I10 Essential (primary) hypertension: Secondary | ICD-10-CM

## 2022-06-16 DIAGNOSIS — G4733 Obstructive sleep apnea (adult) (pediatric): Secondary | ICD-10-CM | POA: Diagnosis not present

## 2022-06-16 DIAGNOSIS — E1169 Type 2 diabetes mellitus with other specified complication: Secondary | ICD-10-CM | POA: Diagnosis not present

## 2022-06-16 DIAGNOSIS — E1165 Type 2 diabetes mellitus with hyperglycemia: Secondary | ICD-10-CM | POA: Diagnosis not present

## 2022-06-16 DIAGNOSIS — L4050 Arthropathic psoriasis, unspecified: Secondary | ICD-10-CM | POA: Diagnosis not present

## 2022-06-16 DIAGNOSIS — I700 Atherosclerosis of aorta: Secondary | ICD-10-CM

## 2022-06-16 DIAGNOSIS — M1A2720 Drug-induced chronic gout, left ankle and foot, without tophus (tophi): Secondary | ICD-10-CM

## 2022-06-16 DIAGNOSIS — E785 Hyperlipidemia, unspecified: Secondary | ICD-10-CM | POA: Diagnosis not present

## 2022-06-16 DIAGNOSIS — Z6834 Body mass index (BMI) 34.0-34.9, adult: Secondary | ICD-10-CM | POA: Diagnosis not present

## 2022-06-16 DIAGNOSIS — K219 Gastro-esophageal reflux disease without esophagitis: Secondary | ICD-10-CM

## 2022-06-16 DIAGNOSIS — E6609 Other obesity due to excess calories: Secondary | ICD-10-CM | POA: Diagnosis not present

## 2022-06-16 LAB — POCT GLYCOSYLATED HEMOGLOBIN (HGB A1C): Hemoglobin A1C: 6.6 % — AB (ref 4.0–5.6)

## 2022-06-16 MED ORDER — VALSARTAN-HYDROCHLOROTHIAZIDE 80-12.5 MG PO TABS: 1.0000 | ORAL_TABLET | Freq: Every day | ORAL | 1 refills | Status: DC
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
## 2022-06-16 NOTE — Assessment & Plan Note (Signed)
C-Met lipid profile today Encouraged him to consume a low-fat diet Continue atorvastatin, fish oil and aspirin

## 2022-06-16 NOTE — Assessment & Plan Note (Signed)
Encourage diet and exercise for weight loss 

## 2022-06-16 NOTE — Assessment & Plan Note (Signed)
Transitioning to Enbrel He will continue to follow with rheumatology

## 2022-06-16 NOTE — Assessment & Plan Note (Signed)
Recently started a new medication by rheumatology, will need to add this to the medication list Continue colchicine as needed

## 2022-06-16 NOTE — Assessment & Plan Note (Signed)
POCT A1c 6.6% We will check urine microalbumin Encouraged him to consume a low-carb diet We will hold off on medication at this time Encourage routine eye exam Encouraged routine foot exam Encouraged him to get a flu shot in the fall Pneumovax and Prevnar UTD COVID-vaccine UTD

## 2022-06-16 NOTE — Assessment & Plan Note (Signed)
Controlled on valsartan HCT Reinforced DASH diet and exercise for weight loss C-Met today

## 2022-06-16 NOTE — Assessment & Plan Note (Signed)
C-Met and lipid profile today Encouraged him to consume a low-fat diet Continue atorvastatin and fish oil 

## 2022-06-16 NOTE — Assessment & Plan Note (Signed)
Encourage weight loss as this can help reduce sleep apnea symptoms Continue CPAP use 

## 2022-06-16 NOTE — Patient Instructions (Signed)

## 2022-06-16 NOTE — Assessment & Plan Note (Signed)
Encourage weight loss as this can help reduce reflux symptoms Okay to take Tums OTC if needed

## 2022-06-17 ENCOUNTER — Encounter: Payer: Self-pay | Admitting: Internal Medicine

## 2022-06-17 LAB — CBC
HCT: 49.7 % (ref 38.5–50.0)
Hemoglobin: 16.9 g/dL (ref 13.2–17.1)
MCH: 32.6 pg (ref 27.0–33.0)
MCHC: 34 g/dL (ref 32.0–36.0)
MCV: 95.8 fL (ref 80.0–100.0)
MPV: 11.7 fL (ref 7.5–12.5)
Platelets: 224 10*3/uL (ref 140–400)
RBC: 5.19 10*6/uL (ref 4.20–5.80)
RDW: 12.6 % (ref 11.0–15.0)
WBC: 7 10*3/uL (ref 3.8–10.8)

## 2022-06-17 LAB — COMPLETE METABOLIC PANEL WITH GFR
AG Ratio: 2 (calc) (ref 1.0–2.5)
ALT: 34 U/L (ref 9–46)
AST: 30 U/L (ref 10–35)
Albumin: 4.3 g/dL (ref 3.6–5.1)
Alkaline phosphatase (APISO): 48 U/L (ref 35–144)
BUN: 23 mg/dL (ref 7–25)
CO2: 30 mmol/L (ref 20–32)
Calcium: 9.5 mg/dL (ref 8.6–10.3)
Chloride: 102 mmol/L (ref 98–110)
Creat: 1.14 mg/dL (ref 0.70–1.28)
Globulin: 2.1 g/dL (calc) (ref 1.9–3.7)
Glucose, Bld: 118 mg/dL — ABNORMAL HIGH (ref 65–99)
Potassium: 4.2 mmol/L (ref 3.5–5.3)
Sodium: 142 mmol/L (ref 135–146)
Total Bilirubin: 1 mg/dL (ref 0.2–1.2)
Total Protein: 6.4 g/dL (ref 6.1–8.1)
eGFR: 69 mL/min/{1.73_m2} (ref >=60)

## 2022-06-17 LAB — LIPID PANEL
Cholesterol: 116 mg/dL (ref <200)
HDL: 37 mg/dL — ABNORMAL LOW (ref >=40)
LDL Cholesterol (Calc): 60 mg/dL (calc)
Non-HDL Cholesterol (Calc): 79 mg/dL (calc) (ref <130)
Total CHOL/HDL Ratio: 3.1 (calc) (ref <5.0)
Triglycerides: 105 mg/dL (ref <150)

## 2022-06-17 LAB — MICROALBUMIN / CREATININE URINE RATIO
Creatinine, Urine: 356 mg/dL — ABNORMAL HIGH (ref 20–320)
Microalb Creat Ratio: 24 mg/g creat (ref <30)
Microalb, Ur: 8.7 mg/dL

## 2022-06-23 ENCOUNTER — Ambulatory Visit (INDEPENDENT_AMBULATORY_CARE_PROVIDER_SITE_OTHER): Payer: Medicare Other

## 2022-06-23 VITALS — BP 132/78 | Ht 69.0 in | Wt 238.0 lb

## 2022-06-23 DIAGNOSIS — Z0000 Encounter for general adult medical examination without abnormal findings: Secondary | ICD-10-CM | POA: Diagnosis not present
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
## 2022-06-23 NOTE — Patient Instructions (Signed)
Samuel Crawford , Thank you for taking time to come for your Medicare Wellness Visit. I appreciate your ongoing commitment to your health goals. Please review the following plan we discussed and let me know if I can assist you in the future.   These are the goals we discussed:  Goals      DIET - EAT MORE FRUITS AND VEGETABLES        This is a list of the screening recommended for you and due dates:  Health Maintenance  Topic Date Due   Eye exam for diabetics  Never done   COVID-19 Vaccine (8 - 2023-24 season) 02/23/2022   Flu Shot  10/20/2022   Hemoglobin A1C  12/17/2022   Yearly kidney function blood test for diabetes  06/16/2023   Yearly kidney health urinalysis for diabetes  06/16/2023   Complete foot exam   06/16/2023   Medicare Annual Wellness Visit  06/23/2023   Cologuard (Stool DNA test)  08/19/2023   Pneumonia Vaccine (3 of 3 - PPSV23 or PCV20) 06/04/2024   DTaP/Tdap/Td vaccine (3 - Td or Tdap) 03/07/2027   Zoster (Shingles) Vaccine  Completed   Hepatitis C Screening: USPSTF Recommendation to screen - Ages 63-79 yo.  Addressed   HPV Vaccine  Aged Out    Advanced directives: no  Conditions/risks identified: none  Next appointment: Follow up in one year for your annual wellness visit. 06/29/23 @ 2:30 pm in person  Preventive Care 65 Years and Older, Male  Preventive care refers to lifestyle choices and visits with your health care provider that can promote health and wellness. What does preventive care include? A yearly physical exam. This is also called an annual well check. Dental exams once or twice a year. Routine eye exams. Ask your health care provider how often you should have your eyes checked. Personal lifestyle choices, including: Daily care of your teeth and gums. Regular physical activity. Eating a healthy diet. Avoiding tobacco and drug use. Limiting alcohol use. Practicing safe sex. Taking low doses of aspirin every day. Taking vitamin and mineral  supplements as recommended by your health care provider. What happens during an annual well check? The services and screenings done by your health care provider during your annual well check will depend on your age, overall health, lifestyle risk factors, and family history of disease. Counseling  Your health care provider may ask you questions about your: Alcohol use. Tobacco use. Drug use. Emotional well-being. Home and relationship well-being. Sexual activity. Eating habits. History of falls. Memory and ability to understand (cognition). Work and work Statistician. Screening  You may have the following tests or measurements: Height, weight, and BMI. Blood pressure. Lipid and cholesterol levels. These may be checked every 5 years, or more frequently if you are over 48 years old. Skin check. Lung cancer screening. You may have this screening every year starting at age 90 if you have a 30-pack-year history of smoking and currently smoke or have quit within the past 15 years. Fecal occult blood test (FOBT) of the stool. You may have this test every year starting at age 64. Flexible sigmoidoscopy or colonoscopy. You may have a sigmoidoscopy every 5 years or a colonoscopy every 10 years starting at age 25. Prostate cancer screening. Recommendations will vary depending on your family history and other risks. Hepatitis C blood test. Hepatitis B blood test. Sexually transmitted disease (STD) testing. Diabetes screening. This is done by checking your blood sugar (glucose) after you have not eaten for a  while (fasting). You may have this done every 1-3 years. Abdominal aortic aneurysm (AAA) screening. You may need this if you are a current or former smoker. Osteoporosis. You may be screened starting at age 33 if you are at high risk. Talk with your health care provider about your test results, treatment options, and if necessary, the need for more tests. Vaccines  Your health care provider  may recommend certain vaccines, such as: Influenza vaccine. This is recommended every year. Tetanus, diphtheria, and acellular pertussis (Tdap, Td) vaccine. You may need a Td booster every 10 years. Zoster vaccine. You may need this after age 11. Pneumococcal 13-valent conjugate (PCV13) vaccine. One dose is recommended after age 30. Pneumococcal polysaccharide (PPSV23) vaccine. One dose is recommended after age 33. Talk to your health care provider about which screenings and vaccines you need and how often you need them. This information is not intended to replace advice given to you by your health care provider. Make sure you discuss any questions you have with your health care provider. Document Released: 04/03/2015 Document Revised: 11/25/2015 Document Reviewed: 01/06/2015 Elsevier Interactive Patient Education  2017 [place] Prevention in the Home Falls can cause injuries. They can happen to people of all ages. There are many things you can do to make your home safe and to help prevent falls. What can I do on the outside of my home? Regularly fix the edges of walkways and driveways and fix any cracks. Remove anything that might make you trip as you walk through a door, such as a raised step or threshold. Trim any bushes or trees on the path to your home. Use bright outdoor lighting. Clear any walking paths of anything that might make someone trip, such as rocks or tools. Regularly check to see if handrails are loose or broken. Make sure that both sides of any steps have handrails. Any raised decks and porches should have guardrails on the edges. Have any leaves, snow, or ice cleared regularly. Use sand or salt on walking paths during winter. Clean up any spills in your garage right away. This includes oil or grease spills. What can I do in the bathroom? Use night lights. Install grab bars by the toilet and in the tub and shower. Do not use towel bars as grab bars. Use  non-skid mats or decals in the tub or shower. If you need to sit down in the shower, use a plastic, non-slip stool. Keep the floor dry. Clean up any water that spills on the floor as soon as it happens. Remove soap buildup in the tub or shower regularly. Attach bath mats securely with double-sided non-slip rug tape. Do not have throw rugs and other things on the floor that can make you trip. What can I do in the bedroom? Use night lights. Make sure that you have a light by your bed that is easy to reach. Do not use any sheets or blankets that are too big for your bed. They should not hang down onto the floor. Have a firm chair that has side arms. You can use this for support while you get dressed. Do not have throw rugs and other things on the floor that can make you trip. What can I do in the kitchen? Clean up any spills right away. Avoid walking on wet floors. Keep items that you use a lot in easy-to-reach places. If you need to reach something above you, use a strong step stool that has a grab  bar. Keep electrical cords out of the way. Do not use floor polish or wax that makes floors slippery. If you must use wax, use non-skid floor wax. Do not have throw rugs and other things on the floor that can make you trip. What can I do with my stairs? Do not leave any items on the stairs. Make sure that there are handrails on both sides of the stairs and use them. Fix handrails that are broken or loose. Make sure that handrails are as long as the stairways. Check any carpeting to make sure that it is firmly attached to the stairs. Fix any carpet that is loose or worn. Avoid having throw rugs at the top or bottom of the stairs. If you do have throw rugs, attach them to the floor with carpet tape. Make sure that you have a light switch at the top of the stairs and the bottom of the stairs. If you do not have them, ask someone to add them for you. What else can I do to help prevent falls? Wear  shoes that: Do not have high heels. Have rubber bottoms. Are comfortable and fit you well. Are closed at the toe. Do not wear sandals. If you use a stepladder: Make sure that it is fully opened. Do not climb a closed stepladder. Make sure that both sides of the stepladder are locked into place. Ask someone to hold it for you, if possible. Clearly mark and make sure that you can see: Any grab bars or handrails. First and last steps. Where the edge of each step is. Use tools that help you move around (mobility aids) if they are needed. These include: Canes. Walkers. Scooters. Crutches. Turn on the lights when you go into a dark area. Replace any light bulbs as soon as they burn out. Set up your furniture so you have a clear path. Avoid moving your furniture around. If any of your floors are uneven, fix them. If there are any pets around you, be aware of where they are. Review your medicines with your doctor. Some medicines can make you feel dizzy. This can increase your chance of falling. Ask your doctor what other things that you can do to help prevent falls. This information is not intended to replace advice given to you by your health care provider. Make sure you discuss any questions you have with your health care provider. Document Released: 01/01/2009 Document Revised: 08/13/2015 Document Reviewed: 04/11/2014 Elsevier Interactive Patient Education  2017 [employer].

## 2022-07-13 DIAGNOSIS — L4050 Arthropathic psoriasis, unspecified: Secondary | ICD-10-CM | POA: Diagnosis not present

## 2022-08-04 DIAGNOSIS — H15001 Unspecified scleritis, right eye: Secondary | ICD-10-CM | POA: Diagnosis not present

## 2022-08-18 DIAGNOSIS — H15001 Unspecified scleritis, right eye: Secondary | ICD-10-CM | POA: Diagnosis not present

## 2022-08-18 DIAGNOSIS — M3501 Sicca syndrome with keratoconjunctivitis: Secondary | ICD-10-CM | POA: Diagnosis not present

## 2022-08-26 ENCOUNTER — Ambulatory Visit: Payer: Medicare Other | Admitting: Internal Medicine

## 2022-09-06 DIAGNOSIS — H539 Unspecified visual disturbance: Secondary | ICD-10-CM | POA: Diagnosis not present

## 2022-09-07 DIAGNOSIS — H40003 Preglaucoma, unspecified, bilateral: Secondary | ICD-10-CM | POA: Diagnosis not present

## 2022-09-12 ENCOUNTER — Other Ambulatory Visit: Payer: Self-pay | Admitting: Ophthalmology

## 2022-09-12 DIAGNOSIS — H539 Unspecified visual disturbance: Secondary | ICD-10-CM | POA: Diagnosis not present

## 2022-09-12 DIAGNOSIS — M3501 Sicca syndrome with keratoconjunctivitis: Secondary | ICD-10-CM | POA: Diagnosis not present

## 2022-09-12 DIAGNOSIS — H15001 Unspecified scleritis, right eye: Secondary | ICD-10-CM | POA: Diagnosis not present

## 2022-09-12 LAB — HM DIABETES EYE EXAM

## 2022-09-20 ENCOUNTER — Ambulatory Visit
Admission: RE | Admit: 2022-09-20 | Discharge: 2022-09-20 | Disposition: A | Discharge status: Home / Self Care | Payer: Medicare Other | Source: Ambulatory Visit | Attending: Ophthalmology | Admitting: Ophthalmology

## 2022-09-20 DIAGNOSIS — H539 Unspecified visual disturbance: Secondary | ICD-10-CM | POA: Diagnosis not present

## 2022-09-20 MED ORDER — GADOBUTROL 1 MMOL/ML IV SOLN
10.0000 mL | Freq: Once | INTRAVENOUS | Status: AC | PRN
  Administered 2022-09-20: 10 mL via INTRAVENOUS

## 2022-10-18 DIAGNOSIS — M1A00X0 Idiopathic chronic gout, unspecified site, without tophus (tophi): Secondary | ICD-10-CM | POA: Diagnosis not present

## 2022-10-18 DIAGNOSIS — Z79899 Other long term (current) drug therapy: Secondary | ICD-10-CM | POA: Diagnosis not present

## 2022-10-18 DIAGNOSIS — L4050 Arthropathic psoriasis, unspecified: Secondary | ICD-10-CM | POA: Diagnosis not present

## 2022-12-14 ENCOUNTER — Ambulatory Visit (INDEPENDENT_AMBULATORY_CARE_PROVIDER_SITE_OTHER): Payer: Medicare Other | Admitting: Internal Medicine

## 2022-12-14 ENCOUNTER — Encounter: Payer: Self-pay | Admitting: Internal Medicine

## 2022-12-14 VITALS — BP 136/78 | HR 52 | Temp 95.7°F | Wt 234.0 lb

## 2022-12-14 DIAGNOSIS — M1A2720 Drug-induced chronic gout, left ankle and foot, without tophus (tophi): Secondary | ICD-10-CM

## 2022-12-14 DIAGNOSIS — E1165 Type 2 diabetes mellitus with hyperglycemia: Secondary | ICD-10-CM | POA: Diagnosis not present

## 2022-12-14 DIAGNOSIS — K219 Gastro-esophageal reflux disease without esophagitis: Secondary | ICD-10-CM

## 2022-12-14 DIAGNOSIS — I10 Essential (primary) hypertension: Secondary | ICD-10-CM

## 2022-12-14 DIAGNOSIS — E785 Hyperlipidemia, unspecified: Secondary | ICD-10-CM

## 2022-12-14 DIAGNOSIS — E66811 Obesity, class 1: Secondary | ICD-10-CM

## 2022-12-14 DIAGNOSIS — L4050 Arthropathic psoriasis, unspecified: Secondary | ICD-10-CM

## 2022-12-14 DIAGNOSIS — G4733 Obstructive sleep apnea (adult) (pediatric): Secondary | ICD-10-CM | POA: Diagnosis not present

## 2022-12-14 DIAGNOSIS — I700 Atherosclerosis of aorta: Secondary | ICD-10-CM | POA: Diagnosis not present

## 2022-12-14 DIAGNOSIS — E1169 Type 2 diabetes mellitus with other specified complication: Secondary | ICD-10-CM | POA: Diagnosis not present

## 2022-12-14 DIAGNOSIS — E6609 Other obesity due to excess calories: Secondary | ICD-10-CM

## 2022-12-14 DIAGNOSIS — Z6834 Body mass index (BMI) 34.0-34.9, adult: Secondary | ICD-10-CM | POA: Diagnosis not present

## 2022-12-14 MED ORDER — VALSARTAN-HYDROCHLOROTHIAZIDE 80-12.5 MG PO TABS: 1.0000 | ORAL_TABLET | Freq: Every day | ORAL | 1 refills | Status: DC

## 2022-12-14 NOTE — Assessment & Plan Note (Signed)
POCT A1c 6.5% Urine microalbumin has been checked within the last year Encourage low-carb diet and exercise for weight loss Will request copy of diabetic eye exam Encouraged routine foot exam Flu shot scheduled for next week Pneumovax and Prevnar UTD

## 2022-12-14 NOTE — Assessment & Plan Note (Signed)
C-Met and lipid profile today Encouraged him to consume a low-fat diet Continue atorvastatin, fish oil and aspirin

## 2022-12-14 NOTE — Assessment & Plan Note (Signed)
Encourage weight loss as this can help reduce sleep apnea symptoms Continue CPAP use

## 2022-12-14 NOTE — Assessment & Plan Note (Signed)
Currently not an issue We will monitor

## 2022-12-14 NOTE — Assessment & Plan Note (Signed)
Continue febuxostat prescribed by rheumatology Encourage low purine diet

## 2022-12-14 NOTE — Assessment & Plan Note (Signed)
C-Met and lipid profile today Encouraged him to consume a low-fat diet Continue atorvastatin and fish oil

## 2022-12-14 NOTE — Assessment & Plan Note (Signed)
Managed on rinvoq prescribed rheumatology

## 2022-12-14 NOTE — Assessment & Plan Note (Signed)
Encouraged diet and exercise for weight loss ?

## 2022-12-14 NOTE — Patient Instructions (Signed)

## 2022-12-14 NOTE — Assessment & Plan Note (Signed)
Controlled on valsartan HCT Reinforced DASH diet and exercise for weight loss C-Met today
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
## 2022-12-15 LAB — LIPID PANEL
Cholesterol: 134 mg/dL (ref <200)
HDL: 43 mg/dL (ref >=40)
LDL Cholesterol (Calc): 71 mg/dL (calc)
Non-HDL Cholesterol (Calc): 91 mg/dL (calc) (ref <130)
Total CHOL/HDL Ratio: 3.1 (calc) (ref <5.0)
Triglycerides: 115 mg/dL (ref <150)

## 2022-12-15 LAB — COMPLETE METABOLIC PANEL WITH GFR
AG Ratio: 2 (calc) (ref 1.0–2.5)
ALT: 28 U/L (ref 9–46)
AST: 28 U/L (ref 10–35)
Albumin: 4.3 g/dL (ref 3.6–5.1)
Alkaline phosphatase (APISO): 52 U/L (ref 35–144)
BUN: 23 mg/dL (ref 7–25)
CO2: 29 mmol/L (ref 20–32)
Calcium: 9.8 mg/dL (ref 8.6–10.3)
Chloride: 102 mmol/L (ref 98–110)
Creat: 1.25 mg/dL (ref 0.70–1.28)
Globulin: 2.2 g/dL (calc) (ref 1.9–3.7)
Glucose, Bld: 111 mg/dL — ABNORMAL HIGH (ref 65–99)
Potassium: 4.4 mmol/L (ref 3.5–5.3)
Sodium: 140 mmol/L (ref 135–146)
Total Bilirubin: 0.7 mg/dL (ref 0.2–1.2)
Total Protein: 6.5 g/dL (ref 6.1–8.1)
eGFR: 62 mL/min/{1.73_m2} (ref >=60)

## 2022-12-15 LAB — CBC
HCT: 49.3 % (ref 38.5–50.0)
Hemoglobin: 16.4 g/dL (ref 13.2–17.1)
MCH: 32.2 pg (ref 27.0–33.0)
MCHC: 33.3 g/dL (ref 32.0–36.0)
MCV: 96.9 fL (ref 80.0–100.0)
MPV: 11.3 fL (ref 7.5–12.5)
Platelets: 256 10*3/uL (ref 140–400)
RBC: 5.09 10*6/uL (ref 4.20–5.80)
RDW: 12.3 % (ref 11.0–15.0)
WBC: 8 10*3/uL (ref 3.8–10.8)

## 2022-12-21 DIAGNOSIS — Z23 Encounter for immunization: Secondary | ICD-10-CM | POA: Diagnosis not present

## 2022-12-27 DIAGNOSIS — Z79899 Other long term (current) drug therapy: Secondary | ICD-10-CM | POA: Diagnosis not present

## 2022-12-27 DIAGNOSIS — M1A00X0 Idiopathic chronic gout, unspecified site, without tophus (tophi): Secondary | ICD-10-CM | POA: Diagnosis not present

## 2022-12-27 DIAGNOSIS — L4050 Arthropathic psoriasis, unspecified: Secondary | ICD-10-CM | POA: Diagnosis not present

## 2023-01-06 DIAGNOSIS — D225 Melanocytic nevi of trunk: Secondary | ICD-10-CM | POA: Diagnosis not present

## 2023-01-06 DIAGNOSIS — L821 Other seborrheic keratosis: Secondary | ICD-10-CM | POA: Diagnosis not present

## 2023-01-06 DIAGNOSIS — D2261 Melanocytic nevi of right upper limb, including shoulder: Secondary | ICD-10-CM | POA: Diagnosis not present

## 2023-01-06 DIAGNOSIS — D2262 Melanocytic nevi of left upper limb, including shoulder: Secondary | ICD-10-CM | POA: Diagnosis not present

## 2023-01-06 DIAGNOSIS — D2271 Melanocytic nevi of right lower limb, including hip: Secondary | ICD-10-CM | POA: Diagnosis not present

## 2023-01-06 DIAGNOSIS — D2272 Melanocytic nevi of left lower limb, including hip: Secondary | ICD-10-CM | POA: Diagnosis not present

## 2023-01-06 DIAGNOSIS — L400 Psoriasis vulgaris: Secondary | ICD-10-CM | POA: Diagnosis not present

## 2023-01-06 DIAGNOSIS — Z872 Personal history of diseases of the skin and subcutaneous tissue: Secondary | ICD-10-CM | POA: Diagnosis not present

## 2023-01-06 DIAGNOSIS — L4059 Other psoriatic arthropathy: Secondary | ICD-10-CM | POA: Diagnosis not present

## 2023-01-06 DIAGNOSIS — L84 Corns and callosities: Secondary | ICD-10-CM | POA: Diagnosis not present

## 2023-01-16 ENCOUNTER — Other Ambulatory Visit: Payer: Self-pay | Admitting: Internal Medicine

## 2023-01-17 NOTE — Telephone Encounter (Signed)
Requested Prescriptions  Pending Prescriptions Disp Refills   atorvastatin (LIPITOR) 10 MG tablet [Pharmacy Med Name: ATORVASTATIN TABS 10MG ] 90 tablet 1    Sig: TAKE 1 TABLET DAILY     Cardiovascular:  Antilipid - Statins Failed - 01/16/2023  3:17 AM      Failed - Lipid Panel in normal range within the last 12 months    Cholesterol, Total  Date Value Ref Range Status  10/14/2015 216 (H) 100 - 199 mg/dL Final   Cholesterol  Date Value Ref Range Status  12/14/2022 134 <200 mg/dL Final   LDL Cholesterol (Calc)  Date Value Ref Range Status  12/14/2022 71 mg/dL (calc) Final    Comment:    Reference range: <100 . Desirable range <100 mg/dL for primary prevention;   <70 mg/dL for patients with CHD or diabetic patients  with > or = 2 CHD risk factors. Marland Kitchen LDL-C is now calculated using the Martin-Hopkins  calculation, which is a validated novel method providing  better accuracy than the Friedewald equation in the  estimation of LDL-C.  Horald Pollen et al. Lenox Ahr. 9381;017(51): 2061-2068  (http://education.[facility].com/faq/FAQ164)    HDL  Date Value Ref Range Status  12/14/2022 43 > OR = 40 mg/dL Final  02/58/5277 39 (L) >39 mg/dL Final   Triglycerides  Date Value Ref Range Status  12/14/2022 115 <150 mg/dL Final         Passed - Patient is not pregnant      Passed - Valid encounter within last 12 months    Recent Outpatient Visits           1 month ago Type 2 diabetes mellitus with hyperglycemia, without long-term current use of insulin [facility])   [facility] [place], [place] W, NP   7 months ago Type 2 diabetes mellitus with hyperglycemia, without long-term current use of insulin [facility])   [place] [facility] [place], Salvadore Oxford, NP   10 months ago Aortic atherosclerosis [facility])   [place] [facility] [place], Salvadore Oxford, NP       Future Appointments             In 4 months Baity, Salvadore Oxford, NP  [facility]

## 2023-02-27 DIAGNOSIS — L4050 Arthropathic psoriasis, unspecified: Secondary | ICD-10-CM | POA: Diagnosis not present

## 2023-02-27 DIAGNOSIS — M1A00X0 Idiopathic chronic gout, unspecified site, without tophus (tophi): Secondary | ICD-10-CM | POA: Diagnosis not present

## 2023-02-27 DIAGNOSIS — Z79899 Other long term (current) drug therapy: Secondary | ICD-10-CM | POA: Diagnosis not present

## 2023-03-03 ENCOUNTER — Encounter: Payer: Self-pay | Admitting: Internal Medicine

## 2023-03-03 NOTE — Telephone Encounter (Signed)
 Care team updated and letter sent for eye exam notes.

## 2023-03-24 ENCOUNTER — Other Ambulatory Visit: Payer: Self-pay | Admitting: Ophthalmology

## 2023-03-24 DIAGNOSIS — H539 Unspecified visual disturbance: Secondary | ICD-10-CM

## 2023-04-07 ENCOUNTER — Ambulatory Visit
Admission: RE | Admit: 2023-04-07 | Discharge: 2023-04-07 | Disposition: A | Discharge status: Home / Self Care | Payer: Medicare Other | Source: Ambulatory Visit | Attending: Ophthalmology | Admitting: Ophthalmology

## 2023-04-07 DIAGNOSIS — H539 Unspecified visual disturbance: Secondary | ICD-10-CM

## 2023-04-07 DIAGNOSIS — H538 Other visual disturbances: Secondary | ICD-10-CM | POA: Diagnosis not present

## 2023-04-07 MED ORDER — GADOPICLENOL 0.5 MMOL/ML IV SOLN
10.0000 mL | Freq: Once | INTRAVENOUS | Status: AC | PRN
  Administered 2023-04-07: 10 mL via INTRAVENOUS

## 2023-05-15 DIAGNOSIS — L4050 Arthropathic psoriasis, unspecified: Secondary | ICD-10-CM | POA: Diagnosis not present

## 2023-05-15 DIAGNOSIS — Z79899 Other long term (current) drug therapy: Secondary | ICD-10-CM | POA: Diagnosis not present

## 2023-05-15 DIAGNOSIS — M1A00X0 Idiopathic chronic gout, unspecified site, without tophus (tophi): Secondary | ICD-10-CM | POA: Diagnosis not present

## 2023-06-14 ENCOUNTER — Encounter: Payer: Self-pay | Admitting: Internal Medicine

## 2023-06-14 ENCOUNTER — Ambulatory Visit (INDEPENDENT_AMBULATORY_CARE_PROVIDER_SITE_OTHER): Payer: TRICARE For Life (TFL) | Admitting: Internal Medicine

## 2023-06-14 VITALS — BP 138/74 | Ht 69.0 in | Wt 229.4 lb

## 2023-06-14 DIAGNOSIS — E1169 Type 2 diabetes mellitus with other specified complication: Secondary | ICD-10-CM | POA: Diagnosis not present

## 2023-06-14 DIAGNOSIS — E66811 Obesity, class 1: Secondary | ICD-10-CM | POA: Diagnosis not present

## 2023-06-14 DIAGNOSIS — M1A2720 Drug-induced chronic gout, left ankle and foot, without tophus (tophi): Secondary | ICD-10-CM

## 2023-06-14 DIAGNOSIS — I10 Essential (primary) hypertension: Secondary | ICD-10-CM

## 2023-06-14 DIAGNOSIS — I700 Atherosclerosis of aorta: Secondary | ICD-10-CM | POA: Diagnosis not present

## 2023-06-14 DIAGNOSIS — E6609 Other obesity due to excess calories: Secondary | ICD-10-CM

## 2023-06-14 DIAGNOSIS — J0110 Acute frontal sinusitis, unspecified: Secondary | ICD-10-CM | POA: Diagnosis not present

## 2023-06-14 DIAGNOSIS — G4733 Obstructive sleep apnea (adult) (pediatric): Secondary | ICD-10-CM

## 2023-06-14 DIAGNOSIS — E785 Hyperlipidemia, unspecified: Secondary | ICD-10-CM

## 2023-06-14 DIAGNOSIS — E1165 Type 2 diabetes mellitus with hyperglycemia: Secondary | ICD-10-CM | POA: Diagnosis not present

## 2023-06-14 DIAGNOSIS — K219 Gastro-esophageal reflux disease without esophagitis: Secondary | ICD-10-CM | POA: Diagnosis not present

## 2023-06-14 DIAGNOSIS — L4050 Arthropathic psoriasis, unspecified: Secondary | ICD-10-CM | POA: Diagnosis not present

## 2023-06-14 MED ORDER — DOXYCYCLINE HYCLATE 100 MG PO TABS: 100.0000 mg | ORAL_TABLET | Freq: Two times a day (BID) | ORAL | 0 refills | Status: DC

## 2023-06-14 MED ORDER — VALSARTAN-HYDROCHLOROTHIAZIDE 80-12.5 MG PO TABS: 1.0000 | ORAL_TABLET | Freq: Every day | ORAL | 1 refills | Status: DC
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
## 2023-06-14 NOTE — Assessment & Plan Note (Signed)
Continue febuxostat prescribed by rheumatology Encourage low purine diet

## 2023-06-14 NOTE — Addendum Note (Signed)
 Addended by: Lorre Munroe on: 06/14/2023 09:00 AM   Modules accepted: Orders

## 2023-06-14 NOTE — Assessment & Plan Note (Signed)
 A1c and urine microalbumin today Encourage low-carb diet and exercise for weight loss Encouraged routine eye exam Encouraged routine foot exam Flu shot UTD Pneumovax and Prevnar UTD

## 2023-06-14 NOTE — Assessment & Plan Note (Signed)
 Encouraged diet and exercise for weight loss ?

## 2023-06-14 NOTE — Assessment & Plan Note (Signed)
C-Met and lipid profile today Encouraged him to consume a low-fat diet Continue atorvastatin, fish oil and aspirin

## 2023-06-14 NOTE — Assessment & Plan Note (Signed)
 Currently not an issue We will monitor

## 2023-06-14 NOTE — Patient Instructions (Signed)
 Low-Purine Eating Plan A low-purine eating plan involves making food choices to limit your purine intake. Purine is a kind of uric acid. Too much uric acid in your blood can cause certain conditions, such as gout and kidney stones. Eating a low-purine diet may help control these conditions. What are tips for following this plan? Shopping Avoid buying products that contain high-fructose corn syrup. Check for this on food labels. It is commonly found in many processed foods and soft drinks. Be sure to check for it in baked goods such as cookies, canned fruits, and cereals and cereal bars. Avoid buying veal, chicken breast with skin, lamb, and organ meats such as liver. These types of meats tend to have the highest purine content. Choose dairy products. These may lower uric acid levels. Avoid certain types of fish. Not all fish and seafood have high purine content. Examples with high purine content include anchovies, trout, tuna, sardines, and salmon. Avoid buying beverages that contain alcohol, particularly beer and hard liquor. Alcohol can affect the way your body gets rid of uric acid. Meal planning  Learn which foods do or do not affect you. If you find out that a food tends to cause your gout symptoms to flare up, avoid eating that food. You can enjoy foods that do not cause problems. If you have any questions about a food item, talk with your dietitian or health care provider. Reduce the overall amount of meat in your diet. When you do eat meat, choose ones with lower purine content. Include plenty of fruits and vegetables. Although some vegetables may have a high purine content--such as asparagus, mushrooms, spinach, or cauliflower--it has been shown that these do not contribute to uric acid blood levels as much. Consume at least 1 dairy serving a day. This has been shown to decrease uric acid levels. General information If you drink alcohol: Limit how much you have to: 0-1 drink a day for  women who are not pregnant. 0-2 drinks a day for men. Know how much alcohol is in a drink. In the [place] one drink equals one 12 oz bottle of beer (355 mL), one 5 oz glass of wine (148 mL), or one 1 oz glass of hard liquor (44 mL). Drink plenty of water. Try to drink enough to keep your urine pale yellow. Fluids can help remove uric acid from your body. Work with your health care provider and dietitian to develop a plan to achieve or maintain a healthy weight. Losing weight may help reduce uric acid in your blood. What foods are recommended? The following are some types of foods that are good choices when limiting purine intake: Fresh or frozen fruits and vegetables. Whole grains, breads, cereals, and pasta. Rice. Beans, peas, legumes. Nuts and seeds. Dairy products. Fats and oils. The items listed above may not be a complete list. Talk with a dietitian about what dietary choices are best for you. What foods are not recommended? Limit your intake of foods high in purines, including: Beer and other alcohol. Meat-based gravy or sauce. Canned or fresh fish, such as: Anchovies, sardines, herring, salmon, and tuna. Mussels and scallops. Codfish, trout, and haddock. Bacon, veal, chicken breast with skin, and lamb. Organ meats, such as: Liver or kidney. Tripe. Sweetbreads (thymus gland or pancreas). Wild Education officer, environmental. Yeast or yeast extract supplements. Drinks sweetened with high-fructose corn syrup, such as soda. Processed foods made with high-fructose corn syrup. The items listed above may not be a complete list of foods  and beverages you should limit. Contact a dietitian for more information. Summary Eating a low-purine diet may help control conditions caused by too much uric acid in the body, such as gout or kidney stones. Choose low-purine foods, limit alcohol, and limit high-fructose corn syrup. You will learn over time which foods do or do not affect you. If you find out that a  food tends to cause your gout symptoms to flare up, avoid eating that food. This information is not intended to replace advice given to you by your health care provider. Make sure you discuss any questions you have with your health care provider. Document Revised: 02/18/2021 Document Reviewed: 02/18/2021 Elsevier Patient Education  2024 [employer].

## 2023-06-14 NOTE — Assessment & Plan Note (Signed)
 Encourage weight loss as this can help reduce sleep apnea symptoms Continue CPAP use

## 2023-06-14 NOTE — Assessment & Plan Note (Signed)
Controlled on valsartan HCT Reinforced DASH diet and exercise for weight loss C-Met today

## 2023-06-14 NOTE — Assessment & Plan Note (Signed)
 Managed on cimzia prescribed rheumatology

## 2023-06-14 NOTE — Assessment & Plan Note (Signed)
C-Met and lipid profile today Encouraged him to consume a low-fat diet Continue atorvastatin and fish oil 

## 2023-06-15 ENCOUNTER — Encounter: Payer: Self-pay | Admitting: Internal Medicine

## 2023-06-15 LAB — COMPLETE METABOLIC PANEL WITHOUT GFR
AG Ratio: 1.9 (calc) (ref 1.0–2.5)
ALT: 23 U/L (ref 9–46)
AST: 23 U/L (ref 10–35)
Albumin: 4.2 g/dL (ref 3.6–5.1)
Alkaline phosphatase (APISO): 61 U/L (ref 35–144)
BUN: 20 mg/dL (ref 7–25)
CO2: 30 mmol/L (ref 20–32)
Calcium: 9.3 mg/dL (ref 8.6–10.3)
Chloride: 97 mmol/L — ABNORMAL LOW (ref 98–110)
Creat: 0.98 mg/dL (ref 0.70–1.28)
Globulin: 2.2 g/dL (ref 1.9–3.7)
Glucose, Bld: 113 mg/dL — ABNORMAL HIGH (ref 65–99)
Potassium: 4.1 mmol/L (ref 3.5–5.3)
Sodium: 134 mmol/L — ABNORMAL LOW (ref 135–146)
Total Bilirubin: 0.7 mg/dL (ref 0.2–1.2)
Total Protein: 6.4 g/dL (ref 6.1–8.1)

## 2023-06-15 LAB — MICROALBUMIN / CREATININE URINE RATIO
Creatinine, Urine: 133 mg/dL (ref 20–320)
Microalb Creat Ratio: 77 mg/g{creat} — ABNORMAL HIGH (ref <30)
Microalb, Ur: 10.3 mg/dL

## 2023-06-15 LAB — CBC
HCT: 47.3 % (ref 38.5–50.0)
Hemoglobin: 16.5 g/dL (ref 13.2–17.1)
MCH: 32.7 pg (ref 27.0–33.0)
MCHC: 34.9 g/dL (ref 32.0–36.0)
MCV: 93.8 fL (ref 80.0–100.0)
MPV: 11.3 fL (ref 7.5–12.5)
Platelets: 267 10*3/uL (ref 140–400)
RBC: 5.04 10*6/uL (ref 4.20–5.80)
RDW: 11.6 % (ref 11.0–15.0)
WBC: 6.9 10*3/uL (ref 3.8–10.8)

## 2023-06-15 LAB — HEMOGLOBIN A1C
Hgb A1c MFr Bld: 6.4 %{Hb} — ABNORMAL HIGH (ref <5.7)
Mean Plasma Glucose: 137 mg/dL
eAG (mmol/L): 7.6 mmol/L

## 2023-06-15 LAB — LIPID PANEL
Cholesterol: 114 mg/dL (ref <200)
HDL: 37 mg/dL — ABNORMAL LOW (ref >=40)
LDL Cholesterol (Calc): 60 mg/dL
Non-HDL Cholesterol (Calc): 77 mg/dL (ref <130)
Total CHOL/HDL Ratio: 3.1 (calc) (ref <5.0)
Triglycerides: 91 mg/dL (ref <150)

## 2023-06-22 ENCOUNTER — Ambulatory Visit: Admitting: Internal Medicine

## 2023-06-22 ENCOUNTER — Encounter: Payer: Self-pay | Admitting: Internal Medicine

## 2023-06-22 VITALS — BP 132/70 | HR 59 | Ht 69.0 in | Wt 227.0 lb

## 2023-06-22 DIAGNOSIS — G4733 Obstructive sleep apnea (adult) (pediatric): Secondary | ICD-10-CM | POA: Diagnosis not present

## 2023-06-22 NOTE — Progress Notes (Signed)
 [facility] [facility] Consultation       Date: 06/22/2023  MRN# [id number] Samuel Crawford 1950-09-10   73 year old male, with a history of obstructive sleep apnea. He continues to do well CPAP, he is using it every night, he is more awake during the day.  He remains more awake during the day, he is not snoring. He is getting new supplies every 3 months and cleans them every week.   **Download dated 03/06/2018-04/04/2018>> raw data personally reviewed, usage greater than 4 hours is 30/30 days.  Average usage on days used is 7 hours 37 minutes, pressure range 5-20.  Leaks are slightly elevated but within normal limits.  Pressure median 7, 95th percentile pressure 10, maximum pressure 12.  Residual AHI is 2.6.  Overall this shows excellent control of obstructive sleep apnea with excellent compliance with CPAP. Download data review, 30 days as of 04/09/17.  Usage is 30/30 days.  Average usage on days used is 7 hours 30 minutes.  Setting is auto, 5-20.  Median pressure is 8.1, 95th percentile pressure is 11.6, maximum pressure is 13.  Residual AHI is 3.8.  Review of outside sleep studies; -Home sleep study 12/12/16; AHI of 24. -Baseline sleep study. 05/27/2005, AHI was 40, increased to 52 when supine. -CPAP titration 08/06/2005; CPAP titrated to a pressure level of 8. Overall these tests show severe affective sleep apnea, adequately treated, at a CPAP level of 8    CC Follow-up assessment for OSA    HPI Patient with a previous history of severe OSA greater than 52 Doing really well with sleep apnea and CPAP therapy  AHI reduced to 3.0  Excellent compliance report 100%  Auto CPAP 5-15  Compliance report reviewed in detail with patient Patient uses and benefits from therapy Using CPAP nightly and with naps Pressure setting is comfortable and is sleeping well. More energy less fatigue   No exacerbation at this time No evidence of heart failure at this time No evidence or signs  of infection at this time No respiratory distress No fevers, chills, nausea, vomiting, diarrhea No evidence of lower extremity edema No evidence hemoptysis  Patient went on a cruise 2 weeks ago Patient had upper respiratory tract infection Patient with acute bronchitis but is improving with doxycycline Patient is wheezing on exam However it is mild No indication for prednisone or additional antibiotics at this time Medication:    Current Outpatient Medications:    aspirin EC 81 MG tablet, Take 1 tablet (81 mg total) by mouth daily. Swallow whole., Disp: 30 tablet, Rfl: 12   atorvastatin (LIPITOR) 10 MG tablet, TAKE 1 TABLET DAILY, Disp: 90 tablet, Rfl: 1   CIMZIA-STARTER 200 MG/ML prefilled syringe, Inject 400 mg into the skin every 14 (fourteen) days., Disp: , Rfl:    doxycycline (VIBRA-TABS) 100 MG tablet, Take 1 tablet (100 mg total) by mouth 2 (two) times daily., Disp: 20 tablet, Rfl: 0   febuxostat (ULORIC) 40 MG tablet, Take 40 mg by mouth daily., Disp: , Rfl:    loratadine (CLARITIN) 10 MG tablet, Take 10 mg by mouth at bedtime. , Disp: , Rfl:    Multiple Vitamin (MULTIVITAMIN) tablet, Take 1 tablet by mouth daily., Disp: , Rfl:    Omega-3 Fatty Acids (FISH OIL) 1200 MG CPDR, Take 1,200 mg by mouth daily., Disp: , Rfl:    valsartan-hydrochlorothiazide (DIOVAN-HCT) 80-12.5 MG tablet, Take 1 tablet by mouth daily., Disp: 90 tablet, Rfl: 1   Wheat Dextrin (BENEFIBER PO), Take by mouth.,  Disp: , Rfl:    Allergies:  Duloxetine hcl, Shellfish allergy, Amoxicillin, Betadine [povidone iodine], and Other   BP 132/70   Pulse (!) 59   Ht 5\' 9"  (1.753 m)   Wt 227 lb (103 kg)   SpO2 97%   BMI 33.52 kg/m       Review of Systems: Gen:  Denies  fever, sweats, chills weight loss  HEENT: Denies blurred vision, double vision, ear pain, eye pain, hearing loss, nose bleeds, sore throat Cardiac:  No dizziness, chest pain or heaviness, chest tightness,edema, No JVD Resp:   No cough,  -sputum production, -shortness of breath,-wheezing, -hemoptysis,  Other:  All other systems negative   Physical Examination:   General Appearance: No distress  EYES PERRLA, EOM intact.   NECK Supple, No JVD Pulmonary: normal breath sounds, No wheezing.  CardiovascularNormal S1,S2.  No m/r/g.   Abdomen: Benign, Soft, non-tender. Neurology UE/LE 5/5 strength, no focal deficits Ext pulses intact, cap refill intact ALL OTHER ROS ARE NEGATIVE      Assessment and Plan:  73 year old pleasant male seen today for follow-up assessment for severe sleep apnea    Assessment of Sleep apnea Patient has excellent compliance report Discussed in detail with patient OSA is well-controlled with CPAP Continue current prescription Patient uses and benefits from therapy Using CPAP nightly.  pressure setting is comfortable and she is sleeping well. Auto CPAP 5-15 AHI reduced to 3 100% compliance  Patient Instructions Continue to use CPAP every night, minimum of 4-6 hours a night.  Change equipment every 30 days or as directed by DME.  Wash your tubing with warm soap and water daily, hang to dry. Wash humidifier portion weekly. Use bottled, distilled water and change daily   Be aware of reduced alertness and do not drive or operate heavy machinery if experiencing this or drowsiness.  Exercise encouraged, as tolerated. Encouraged proper weight management.  Important to get eight or more hours of sleep  Limiting the use of the computer and television before bedtime.  Decrease naps during the day, so night time sleep will become enhanced.  Limit caffeine, and sleep deprivation.  HTN, stroke, uncontrolled diabetes and heart failure are potential risk factors.  Risk of untreated sleep apnea including cardiac arrhthymias, stroke, DM, pulm HTN.    Ho Pulmonary embolism. PROVOKED Patient developed a DVT from PICC line, then developed PE. Is now off of anticoagulation.   Acute  bronchitis Continue antibiotics as prescribed  MEDICATION ADJUSTMENTS/LABS AND TESTS ORDERED: Continue CPAP as prescribed     CURRENT MEDICATIONS REVIEWED AT LENGTH WITH PATIENT TODAY   Patient  satisfied with Plan of action and management. All questions answered   Follow up 6 months   I spent a total of 41 minutes reviewing chart data, face-to-face evaluation with the patient, counseling and coordination of care as detailed above.      Lucie Leather, M.D.  [facility]  Medical Director [facility] Medical Director [facility] Cardio-Pulmonary Department

## 2023-06-22 NOTE — Patient Instructions (Signed)
 Excellent Job A+ GOLD STAR!!  Continue CPAP as prescribed  Avoid Allergens and Irritants Avoid secondhand smoke Avoid SICK contacts Recommend  Masking  when appropriate Recommend Keep up-to-date with vaccinations   Be aware of reduced alertness and do not drive or operate heavy machinery if experiencing this or drowsiness.  Exercise encouraged, as tolerated. Encouraged proper weight management.  Important to get eight or more hours of sleep  Limiting the use of the computer and television before bedtime.  Decrease naps during the day, so night time sleep will become enhanced.  Limit caffeine, and sleep deprivation.

## 2023-06-30 ENCOUNTER — Ambulatory Visit: Payer: Medicare Other

## 2023-06-30 VITALS — Ht 69.0 in | Wt 227.0 lb

## 2023-06-30 DIAGNOSIS — Z0000 Encounter for general adult medical examination without abnormal findings: Secondary | ICD-10-CM

## 2023-06-30 NOTE — Patient Instructions (Addendum)
 Samuel Crawford , Thank you for taking time to come for your Medicare Wellness Visit. I appreciate your ongoing commitment to your health goals. Please review the following plan we discussed and let me know if I can assist you in the future.   Referrals/Orders/Follow-Ups/Clinician Recommendations: It was nice talking to you today.  You will be due for another Cologuard in May of this year.  Please discuss with your PCP during your next visit. Keep up the good work.  This is a list of the screening recommended for you and due dates:  Health Maintenance  Topic Date Due   Medicare Annual Wellness Visit  06/23/2023   Complete foot exam   06/16/2023   COVID-19 Vaccine (8 - 2024-25 season) 06/30/2023*   Cologuard (Stool DNA test)  08/19/2023   Eye exam for diabetics  09/12/2023   Flu Shot  10/20/2023   Hemoglobin A1C  12/15/2023   Pneumonia Vaccine (3 of 3 - PCV20 or PCV21) 06/04/2024   Yearly kidney function blood test for diabetes  06/13/2024   Yearly kidney health urinalysis for diabetes  06/13/2024   DTaP/Tdap/Td vaccine (3 - Td or Tdap) 03/07/2027   Zoster (Shingles) Vaccine  Completed   Hepatitis C Screening  Addressed   HPV Vaccine  Aged Out   Meningitis B Vaccine  Aged Out  *Topic was postponed. The date shown is not the original due date.    Advanced directives: (Copy Requested) Please bring a copy of your health care power of attorney and living will to the office to be added to your chart at your convenience. You can mail to [facility] [place] [address] [place], [place] [address] or email to [email or web address]  Next Medicare Annual Wellness Visit scheduled for next year: No
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
## 2023-07-06 ENCOUNTER — Other Ambulatory Visit: Payer: Self-pay | Admitting: Internal Medicine

## 2023-07-07 NOTE — Telephone Encounter (Signed)
 Requested Prescriptions  Pending Prescriptions Disp Refills   atorvastatin  (LIPITOR) 10 MG tablet [Pharmacy Med Name: ATORVASTATIN  TABS 10MG ] 90 tablet 1    Sig: TAKE 1 TABLET DAILY     Cardiovascular:  Antilipid - Statins Failed - 07/07/2023 11:19 AM      Failed - Lipid Panel in normal range within the last 12 months    Cholesterol, Total  Date Value Ref Range Status  10/14/2015 216 (H) 100 - 199 mg/dL Final   Cholesterol  Date Value Ref Range Status  06/14/2023 114 <200 mg/dL Final   LDL Cholesterol (Calc)  Date Value Ref Range Status  06/14/2023 60 mg/dL (calc) Final    Comment:    Reference range: <100 . Desirable range <100 mg/dL for primary prevention;   <70 mg/dL for patients with CHD or diabetic patients  with > or = 2 CHD risk factors. Aaron Aas LDL-C is now calculated using the Martin-Hopkins  calculation, which is a validated novel method providing  better accuracy than the Friedewald equation in the  estimation of LDL-C.  Melinda Sprawls et al. Erroll Heard. 1096;045(40): 2061-2068  (http://education.[facility].com/faq/FAQ164)    HDL  Date Value Ref Range Status  06/14/2023 37 (L) > OR = 40 mg/dL Final  98/01/9146 39 (L) >39 mg/dL Final   Triglycerides  Date Value Ref Range Status  06/14/2023 91 <150 mg/dL Final         Passed - Patient is not pregnant      Passed - Valid encounter within last 12 months    Recent Outpatient Visits           3 weeks ago Type 2 diabetes mellitus with hyperglycemia, without long-term current use of insulin [facility])   [place] [facility] [place], [place], [place]

## 2023-07-17 ENCOUNTER — Encounter: Payer: Self-pay | Admitting: Internal Medicine

## 2023-07-17 ENCOUNTER — Ambulatory Visit (INDEPENDENT_AMBULATORY_CARE_PROVIDER_SITE_OTHER): Admitting: Internal Medicine

## 2023-07-17 VITALS — BP 124/68 | HR 69 | Ht 69.0 in | Wt 224.6 lb

## 2023-07-17 DIAGNOSIS — R051 Acute cough: Secondary | ICD-10-CM

## 2023-07-17 DIAGNOSIS — J0140 Acute pansinusitis, unspecified: Secondary | ICD-10-CM

## 2023-07-17 MED ORDER — HYDROCODONE BIT-HOMATROP MBR 5-1.5 MG/5ML PO SOLN: 5.0000 mL | Freq: Three times a day (TID) | ORAL | 0 refills | Status: DC | PRN

## 2023-07-17 MED ORDER — AZITHROMYCIN 250 MG PO TABS: ORAL_TABLET | ORAL | 0 refills | Status: DC

## 2023-07-17 MED ORDER — ALBUTEROL SULFATE HFA 108 (90 BASE) MCG/ACT IN AERS: 2.0000 | INHALATION_SPRAY | Freq: Four times a day (QID) | RESPIRATORY_TRACT | 0 refills | Status: DC | PRN

## 2023-07-17 MED ORDER — BENZONATATE 100 MG PO CAPS: 100.0000 mg | ORAL_CAPSULE | Freq: Three times a day (TID) | ORAL | 0 refills | Status: DC | PRN
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
## 2023-07-17 NOTE — Patient Instructions (Signed)

## 2023-07-19 ENCOUNTER — Ambulatory Visit: Admitting: Internal Medicine

## 2023-08-23 DIAGNOSIS — L4050 Arthropathic psoriasis, unspecified: Secondary | ICD-10-CM | POA: Diagnosis not present

## 2023-08-23 DIAGNOSIS — Z79899 Other long term (current) drug therapy: Secondary | ICD-10-CM | POA: Diagnosis not present

## 2023-08-23 DIAGNOSIS — M1A9XX0 Chronic gout, unspecified, without tophus (tophi): Secondary | ICD-10-CM | POA: Diagnosis not present

## 2023-10-16 ENCOUNTER — Telehealth: Payer: Self-pay

## 2023-10-16 DIAGNOSIS — G4733 Obstructive sleep apnea (adult) (pediatric): Secondary | ICD-10-CM

## 2023-10-16 NOTE — Telephone Encounter (Signed)
 Copied from CRM [id number]. Topic: Clinical - Order For Equipment >> Oct 16, 2023 10:42 AM Russell PARAS wrote: Reason for CRM:   Pt is contacting clinic to request order for travel CPAP. Order needs to be faxed to [facility].   FX# [phone]  CB#  [phone] (Pt home #)

## 2023-10-16 NOTE — Telephone Encounter (Signed)
Order has been placed and I have notified the patient.  Nothing further needed.

## 2023-10-16 NOTE — Telephone Encounter (Signed)
 Patient last see in April. Okay to send order for Travel CPAP?  Dr. Isaiah is out of the office. Dr. Jess can you advise?

## 2023-10-23 ENCOUNTER — Encounter: Payer: Self-pay | Admitting: Internal Medicine

## 2023-10-26 NOTE — Telephone Encounter (Signed)
 Noted. Nothing further needed.

## 2023-10-26 NOTE — Telephone Encounter (Signed)
 I spoke with [name] and let her know the email address worked and I have an Manufacturing engineer from [place] with [employer] Hello [name], Thank you  we have received Samuel Crawford's prescription. His order has shippped out.  A shipping and tracking email has been sent.  In addition, I've added his prescription to our files.  Thanks again,   Have a wonderful day.  Take care, care team -Burnard

## 2023-11-01 DIAGNOSIS — H43813 Vitreous degeneration, bilateral: Secondary | ICD-10-CM | POA: Diagnosis not present

## 2023-11-02 DIAGNOSIS — Z79899 Other long term (current) drug therapy: Secondary | ICD-10-CM | POA: Diagnosis not present

## 2023-11-02 DIAGNOSIS — L4050 Arthropathic psoriasis, unspecified: Secondary | ICD-10-CM | POA: Diagnosis not present

## 2023-11-02 DIAGNOSIS — M1A9XX0 Chronic gout, unspecified, without tophus (tophi): Secondary | ICD-10-CM | POA: Diagnosis not present

## 2023-11-24 ENCOUNTER — Encounter: Payer: Self-pay | Admitting: Internal Medicine

## 2023-11-24 MED ORDER — COVID-19 MRNA VAC-TRIS(PFIZER) 30 MCG/0.3ML IM SUSY: 0.3000 mL | PREFILLED_SYRINGE | Freq: Once | INTRAMUSCULAR | 0 refills | Status: AC

## 2023-12-04 ENCOUNTER — Ambulatory Visit (INDEPENDENT_AMBULATORY_CARE_PROVIDER_SITE_OTHER): Admitting: Internal Medicine

## 2023-12-04 ENCOUNTER — Encounter: Payer: Self-pay | Admitting: Internal Medicine

## 2023-12-04 VITALS — BP 138/70 | Ht 69.0 in | Wt 218.2 lb

## 2023-12-04 DIAGNOSIS — N400 Enlarged prostate without lower urinary tract symptoms: Secondary | ICD-10-CM | POA: Diagnosis not present

## 2023-12-04 DIAGNOSIS — I10 Essential (primary) hypertension: Secondary | ICD-10-CM

## 2023-12-04 DIAGNOSIS — Z1211 Encounter for screening for malignant neoplasm of colon: Secondary | ICD-10-CM | POA: Diagnosis not present

## 2023-12-04 DIAGNOSIS — G4733 Obstructive sleep apnea (adult) (pediatric): Secondary | ICD-10-CM

## 2023-12-04 DIAGNOSIS — Z125 Encounter for screening for malignant neoplasm of prostate: Secondary | ICD-10-CM

## 2023-12-04 DIAGNOSIS — E785 Hyperlipidemia, unspecified: Secondary | ICD-10-CM

## 2023-12-04 DIAGNOSIS — E1169 Type 2 diabetes mellitus with other specified complication: Secondary | ICD-10-CM

## 2023-12-04 DIAGNOSIS — E1165 Type 2 diabetes mellitus with hyperglycemia: Secondary | ICD-10-CM

## 2023-12-04 DIAGNOSIS — E6609 Other obesity due to excess calories: Secondary | ICD-10-CM

## 2023-12-04 DIAGNOSIS — E66811 Obesity, class 1: Secondary | ICD-10-CM

## 2023-12-04 DIAGNOSIS — L4050 Arthropathic psoriasis, unspecified: Secondary | ICD-10-CM

## 2023-12-04 DIAGNOSIS — K219 Gastro-esophageal reflux disease without esophagitis: Secondary | ICD-10-CM

## 2023-12-04 DIAGNOSIS — I700 Atherosclerosis of aorta: Secondary | ICD-10-CM

## 2023-12-04 DIAGNOSIS — M1A2720 Drug-induced chronic gout, left ankle and foot, without tophus (tophi): Secondary | ICD-10-CM

## 2023-12-04 MED ORDER — VALSARTAN-HYDROCHLOROTHIAZIDE 80-12.5 MG PO TABS: 1.0000 | ORAL_TABLET | Freq: Every day | ORAL | 1 refills | Status: AC

## 2023-12-04 NOTE — Assessment & Plan Note (Signed)
 A1c today Urine microalbumin has been done in the last year  Encourage low-carb diet and exercise for weight loss Encouraged routine eye exam Encouraged routine foot exam Flu shot UTD Pneumovax and Prevnar UTD

## 2023-12-04 NOTE — Assessment & Plan Note (Signed)
 Currently not an issue We will monitor

## 2023-12-04 NOTE — Assessment & Plan Note (Signed)
 Continue febuxostat 40 mg daily as prescribed by rheumatology Uric acid checked by urology Encourage low purine diet

## 2023-12-04 NOTE — Assessment & Plan Note (Signed)
 Managed on cimzia 400 mg every 2 weeks, leflunomide 20 mg daily and celebrex 100 mg BID prn as prescribed rheumatology

## 2023-12-04 NOTE — Assessment & Plan Note (Signed)
 C-Met and lipid profile today Encouraged him to consume a low-fat diet Continue atorvastatin  10 mg, fish oil 1200 mg and aspirin  81 mg daily

## 2023-12-04 NOTE — Assessment & Plan Note (Signed)
 Encourage weight loss as this can help reduce sleep apnea symptoms Continue CPAP use
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
## 2023-12-04 NOTE — Assessment & Plan Note (Signed)
 C-Met and lipid profile today Encouraged him to consume a low-fat diet Continue atorvastatin  10 mg and fish oil 1200 mg daily

## 2023-12-04 NOTE — Patient Instructions (Signed)
 Low-Purine Diet A low-purine diet involves making choices to limit how much purine you take in. Purine is a kind of uric acid. If you have too much uric acid in your blood, it can cause problems like gout and kidney stones. Eating a low-purine diet can help control those problems. What are tips for following this plan? Shopping Avoid buying things that have high-fructose corn syrup in them. Check for this on food labels. Be sure to check for it in: [employer], such as cookies. Canned fruits. Cereals and cereal bars. Avoid buying meats with a high purine content. These include: Veal. Chicken breast with skin. Lamb. Organ meats, such as liver. Choose dairy products. These may lower uric acid levels. Avoid buying certain types of fish. These include: Anchovies. Trout. Tuna. Sardines. Salmon. Avoid buying drinks with alcohol in them. Alcohol can affect the way your body gets rid of uric acid. Stay away from: [facility]. Hard liquor. Meal planning  Learn which foods do or don't affect you. If you find a food that causes problems, avoid eating that food. If you have any questions about a food item, talk with your health care provider. Eat less meat. When you do eat meat, choose meats that have less purine in them. Eat lots of fruits and vegetables. Even though some vegetables have more purine in them, they don't add as much to the amount of uric acid in your blood as other foods. Have at least one serving of dairy a day. General information If you drink alcohol: Limit how much you have to: 0-1 drink a day if you're male. 0-2 drinks a day if you're male. Know how much alcohol is in your drink. In the [place] one drink is one 12 oz bottle of beer (355 mL), one 5 oz glass of wine (148 mL), or one 1 oz glass of hard liquor (44 mL). Drink more fluids as told. Fluids can help remove uric acid from your body. Work with your provider to make a plan to help you lose weight or stay at a healthy  weight. Losing weight can help lower how much uric acid is in your blood. What foods are recommended? You can have: Fresh or frozen fruits and vegetables. Whole grains, breads, cereals, and pasta. Rice. Beans, peas, and legumes. Nuts and seeds. Dairy products. Fats and oils. The items listed above may not be all the foods and drinks you can have. Talk with an expert in healthy eating called a dietitian to learn more. What foods are not recommended? Limit your intake of foods and drinks high in purines. These include: Beer and other types of alcohol. Meat-based gravy or sauce. Canned or fresh seafood, such as: Anchovies, sardines, herring, salmon, and tuna. Mussels, scallops, shrimp, and crab. Codfish, trout, and haddock. Bacon, veal, chicken breast with skin, and lamb. Organ meats, such as: Liver or kidney. Tripe. Sweetbreads (thymus gland or pancreas). Wild Education officer, environmental. Yeast or yeast extract supplements. Drinks that have been made sweeter with high-fructose corn syrup. These include sodas. Processed foods made with high-fructose corn syrup. The items listed above may not be all the foods and drinks you should limit. Talk with a dietitian to learn more. This information is not intended to replace advice given to you by your health care provider. Make sure you discuss any questions you have with your health care provider. Document Revised: 06/10/2023 Document Reviewed: 06/10/2023 Elsevier Patient Education  2025 [employer].

## 2023-12-04 NOTE — Assessment & Plan Note (Signed)
 Controlled on valsartan  HCT80-12.5 mg daily Reinforced DASH diet and exercise for weight loss C-Met today

## 2023-12-04 NOTE — Assessment & Plan Note (Signed)
 Encouraged diet and exercise for weight loss ?

## 2023-12-05 ENCOUNTER — Ambulatory Visit: Payer: Self-pay | Admitting: Internal Medicine

## 2023-12-05 LAB — COMPREHENSIVE METABOLIC PANEL WITH GFR
AG Ratio: 1.9 (calc) (ref 1.0–2.5)
ALT: 23 U/L (ref 9–46)
AST: 24 U/L (ref 10–35)
Albumin: 4.2 g/dL (ref 3.6–5.1)
Alkaline phosphatase (APISO): 50 U/L (ref 35–144)
BUN: 24 mg/dL (ref 7–25)
CO2: 33 mmol/L — ABNORMAL HIGH (ref 20–32)
Calcium: 9.1 mg/dL (ref 8.6–10.3)
Chloride: 101 mmol/L (ref 98–110)
Creat: 0.98 mg/dL (ref 0.70–1.28)
Globulin: 2.2 g/dL (ref 1.9–3.7)
Glucose, Bld: 115 mg/dL — ABNORMAL HIGH (ref 65–99)
Potassium: 4.3 mmol/L (ref 3.5–5.3)
Sodium: 140 mmol/L (ref 135–146)
Total Bilirubin: 0.7 mg/dL (ref 0.2–1.2)
Total Protein: 6.4 g/dL (ref 6.1–8.1)
eGFR: 82 mL/min/1.73m2 (ref >=60)

## 2023-12-05 LAB — LIPID PANEL
Cholesterol: 102 mg/dL (ref <200)
HDL: 37 mg/dL — ABNORMAL LOW (ref >=40)
LDL Cholesterol (Calc): 45 mg/dL
Non-HDL Cholesterol (Calc): 65 mg/dL (ref <130)
Total CHOL/HDL Ratio: 2.8 (calc) (ref <5.0)
Triglycerides: 112 mg/dL (ref <150)

## 2023-12-05 LAB — CBC
HCT: 49.1 % (ref 38.5–50.0)
Hemoglobin: 16.3 g/dL (ref 13.2–17.1)
MCH: 32 pg (ref 27.0–33.0)
MCHC: 33.2 g/dL (ref 32.0–36.0)
MCV: 96.5 fL (ref 80.0–100.0)
MPV: 11.9 fL (ref 7.5–12.5)
Platelets: 207 Thousand/uL (ref 140–400)
RBC: 5.09 Million/uL (ref 4.20–5.80)
RDW: 12.1 % (ref 11.0–15.0)
WBC: 6.1 Thousand/uL (ref 3.8–10.8)

## 2023-12-05 LAB — HEMOGLOBIN A1C
Hgb A1c MFr Bld: 6 % — ABNORMAL HIGH (ref <5.7)
Mean Plasma Glucose: 126 mg/dL
eAG (mmol/L): 7 mmol/L

## 2023-12-05 LAB — PSA: PSA: 1.01 ng/mL (ref <=4.00)

## 2023-12-12 DIAGNOSIS — Z1211 Encounter for screening for malignant neoplasm of colon: Secondary | ICD-10-CM | POA: Diagnosis not present

## 2023-12-17 LAB — COLOGUARD: COLOGUARD: NEGATIVE

## 2024-01-03 ENCOUNTER — Other Ambulatory Visit: Payer: Self-pay | Admitting: Internal Medicine

## 2024-01-04 NOTE — Telephone Encounter (Signed)
 Requested by interface surescripts. Future visit 06/03/24.  Requested Prescriptions  Pending Prescriptions Disp Refills   atorvastatin  (LIPITOR) 10 MG tablet [Pharmacy Med Name: ATORVASTATIN  TABS 10MG ] 90 tablet 1    Sig: TAKE 1 TABLET DAILY     Cardiovascular:  Antilipid - Statins Failed - 01/04/2024  4:23 PM      Failed - Lipid Panel in normal range within the last 12 months    Cholesterol, Total  Date Value Ref Range Status  10/14/2015 216 (H) 100 - 199 mg/dL Final   Cholesterol  Date Value Ref Range Status  12/04/2023 102 <200 mg/dL Final   LDL Cholesterol (Calc)  Date Value Ref Range Status  12/04/2023 45 mg/dL (calc) Final    Comment:    Reference range: <100 . Desirable range <100 mg/dL for primary prevention;   <70 mg/dL for patients with CHD or diabetic patients  with > or = 2 CHD risk factors. SABRA LDL-C is now calculated using the Martin-Hopkins  calculation, which is a validated novel method providing  better accuracy than the Friedewald equation in the  estimation of LDL-C.  Gladis APPLETHWAITE et al. SANDREA. 7986;689(80): 2061-2068  (http://education.[employer].com/faq/FAQ164)    HDL  Date Value Ref Range Status  12/04/2023 37 (L) > OR = 40 mg/dL Final  92/73/7982 39 (L) >39 mg/dL Final   Triglycerides  Date Value Ref Range Status  12/04/2023 112 <150 mg/dL Final         Passed - Patient is not pregnant      Passed - Valid encounter within last 12 months    Recent Outpatient Visits           1 month ago Type 2 diabetes mellitus with hyperglycemia, without long-term current use of insulin [facility])   [place] [facility] [place], Angeline ORN, NP   5 months ago Acute non-recurrent pansinusitis   [place] [facility] [place], [place] W, NP   6 months ago Type 2 diabetes mellitus with hyperglycemia, without long-term current use of insulin [facility])   [place] [facility] [place], Angeline ORN, [place]

## 2024-01-16 DIAGNOSIS — M71371 Other bursal cyst, right ankle and foot: Secondary | ICD-10-CM | POA: Diagnosis not present

## 2024-01-16 DIAGNOSIS — R208 Other disturbances of skin sensation: Secondary | ICD-10-CM | POA: Diagnosis not present

## 2024-01-16 DIAGNOSIS — D2272 Melanocytic nevi of left lower limb, including hip: Secondary | ICD-10-CM | POA: Diagnosis not present

## 2024-01-16 DIAGNOSIS — L820 Inflamed seborrheic keratosis: Secondary | ICD-10-CM | POA: Diagnosis not present

## 2024-01-16 DIAGNOSIS — L400 Psoriasis vulgaris: Secondary | ICD-10-CM | POA: Diagnosis not present

## 2024-01-16 DIAGNOSIS — D2262 Melanocytic nevi of left upper limb, including shoulder: Secondary | ICD-10-CM | POA: Diagnosis not present

## 2024-01-16 DIAGNOSIS — D2261 Melanocytic nevi of right upper limb, including shoulder: Secondary | ICD-10-CM | POA: Diagnosis not present

## 2024-01-16 DIAGNOSIS — D225 Melanocytic nevi of trunk: Secondary | ICD-10-CM | POA: Diagnosis not present

## 2024-01-16 DIAGNOSIS — L538 Other specified erythematous conditions: Secondary | ICD-10-CM | POA: Diagnosis not present

## 2024-01-16 DIAGNOSIS — L570 Actinic keratosis: Secondary | ICD-10-CM | POA: Diagnosis not present

## 2024-02-08 DIAGNOSIS — Z79899 Other long term (current) drug therapy: Secondary | ICD-10-CM | POA: Diagnosis not present

## 2024-02-08 DIAGNOSIS — L4050 Arthropathic psoriasis, unspecified: Secondary | ICD-10-CM | POA: Diagnosis not present

## 2024-02-08 DIAGNOSIS — M1A9XX0 Chronic gout, unspecified, without tophus (tophi): Secondary | ICD-10-CM | POA: Diagnosis not present

## 2024-06-03 ENCOUNTER — Ambulatory Visit: Admitting: Internal Medicine
